# Patient Record
Sex: Female | Born: 1935 | Race: Black or African American | Hispanic: No | Smoking: Never smoker
Health system: Southern US, Community
[De-identification: ages and names within clinical notes are randomized; demographics above are authoritative.]

## PROBLEM LIST (undated history)

## (undated) DIAGNOSIS — I10 Essential (primary) hypertension: Secondary | ICD-10-CM

## (undated) DIAGNOSIS — N289 Disorder of kidney and ureter, unspecified: Secondary | ICD-10-CM

## (undated) DIAGNOSIS — C50919 Malignant neoplasm of unspecified site of unspecified female breast: Secondary | ICD-10-CM

## (undated) DIAGNOSIS — E119 Type 2 diabetes mellitus without complications: Secondary | ICD-10-CM

## (undated) HISTORY — DX: Essential (primary) hypertension: I10

## (undated) HISTORY — PX: NO PAST SURGERIES: SHX2092

## (undated) HISTORY — DX: Malignant neoplasm of unspecified site of unspecified female breast: C50.919

---

## 2009-06-28 ENCOUNTER — Ambulatory Visit (HOSPITAL_COMMUNITY): Admission: RE | Admit: 2009-06-28 | Discharge: 2009-06-28 | Payer: Self-pay | Admitting: Internal Medicine

## 2012-06-12 ENCOUNTER — Ambulatory Visit (HOSPITAL_COMMUNITY)
Admission: RE | Admit: 2012-06-12 | Discharge: 2012-06-12 | Disposition: A | Discharge status: Home / Self Care | Payer: Medicare Other | Source: Ambulatory Visit | Attending: Internal Medicine | Admitting: Internal Medicine

## 2012-06-12 ENCOUNTER — Other Ambulatory Visit (HOSPITAL_COMMUNITY): Payer: Self-pay | Admitting: Internal Medicine

## 2012-06-12 DIAGNOSIS — M25552 Pain in left hip: Secondary | ICD-10-CM

## 2012-06-12 DIAGNOSIS — M25551 Pain in right hip: Secondary | ICD-10-CM

## 2012-06-12 DIAGNOSIS — M545 Low back pain, unspecified: Secondary | ICD-10-CM | POA: Insufficient documentation

## 2012-06-12 DIAGNOSIS — M25559 Pain in unspecified hip: Secondary | ICD-10-CM | POA: Insufficient documentation

## 2013-05-06 ENCOUNTER — Emergency Department (HOSPITAL_COMMUNITY): Payer: Medicare Other

## 2013-05-06 ENCOUNTER — Inpatient Hospital Stay (HOSPITAL_COMMUNITY)
Admission: EM | Admit: 2013-05-06 | Discharge: 2013-05-11 | DRG: 584 | Disposition: A | Discharge status: Home Health | Payer: Medicare Other | Attending: Internal Medicine | Admitting: Internal Medicine

## 2013-05-06 ENCOUNTER — Encounter (HOSPITAL_COMMUNITY): Payer: Self-pay | Admitting: Emergency Medicine

## 2013-05-06 DIAGNOSIS — C50919 Malignant neoplasm of unspecified site of unspecified female breast: Principal | ICD-10-CM | POA: Diagnosis present

## 2013-05-06 DIAGNOSIS — I10 Essential (primary) hypertension: Secondary | ICD-10-CM

## 2013-05-06 DIAGNOSIS — Z87891 Personal history of nicotine dependence: Secondary | ICD-10-CM

## 2013-05-06 DIAGNOSIS — C779 Secondary and unspecified malignant neoplasm of lymph node, unspecified: Secondary | ICD-10-CM

## 2013-05-06 HISTORY — DX: Type 2 diabetes mellitus without complications: E11.9

## 2013-05-06 HISTORY — DX: Disorder of kidney and ureter, unspecified: N28.9

## 2013-05-06 LAB — URINALYSIS, ROUTINE W REFLEX MICROSCOPIC
BILIRUBIN URINE: NEGATIVE
GLUCOSE, UA: NEGATIVE mg/dL
Hgb urine dipstick: NEGATIVE
Ketones, ur: NEGATIVE mg/dL
LEUKOCYTES UA: NEGATIVE
Nitrite: NEGATIVE
PROTEIN: NEGATIVE mg/dL
Specific Gravity, Urine: 1.025 (ref 1.005–1.030)
UROBILINOGEN UA: 1 mg/dL (ref 0.0–1.0)
pH: 5.5 (ref 5.0–8.0)

## 2013-05-06 LAB — CBC WITH DIFFERENTIAL/PLATELET
Basophils Absolute: 0 10*3/uL (ref 0.0–0.1)
Basophils Relative: 0 % (ref 0–1)
Eosinophils Absolute: 0.2 10*3/uL (ref 0.0–0.7)
Eosinophils Relative: 3 % (ref 0–5)
HEMATOCRIT: 31.6 % — AB (ref 36.0–46.0)
HEMOGLOBIN: 10.3 g/dL — AB (ref 12.0–15.0)
Lymphocytes Relative: 32 % (ref 12–46)
Lymphs Abs: 1.6 10*3/uL (ref 0.7–4.0)
MCH: 27.3 pg (ref 26.0–34.0)
MCHC: 32.6 g/dL (ref 30.0–36.0)
MCV: 83.8 fL (ref 78.0–100.0)
Monocytes Absolute: 0.5 10*3/uL (ref 0.1–1.0)
Monocytes Relative: 11 % (ref 3–12)
NEUTROS ABS: 2.5 10*3/uL (ref 1.7–7.7)
NEUTROS PCT: 54 % (ref 43–77)
Platelets: 243 10*3/uL (ref 150–400)
RBC: 3.77 MIL/uL — AB (ref 3.87–5.11)
RDW: 14.3 % (ref 11.5–15.5)
WBC: 4.8 10*3/uL (ref 4.0–10.5)

## 2013-05-06 LAB — COMPREHENSIVE METABOLIC PANEL
ALBUMIN: 3.2 g/dL — AB (ref 3.5–5.2)
ALT: 6 U/L (ref 0–35)
AST: 14 U/L (ref 0–37)
Alkaline Phosphatase: 154 U/L — ABNORMAL HIGH (ref 39–117)
BUN: 20 mg/dL (ref 6–23)
CALCIUM: 11.6 mg/dL — AB (ref 8.4–10.5)
CO2: 25 meq/L (ref 19–32)
Chloride: 101 mEq/L (ref 96–112)
Creatinine, Ser: 0.84 mg/dL (ref 0.50–1.10)
GFR, EST AFRICAN AMERICAN: 74 mL/min — AB (ref >90)
GFR, EST NON AFRICAN AMERICAN: 63 mL/min — AB (ref >90)
GLUCOSE: 112 mg/dL — AB (ref 70–99)
POTASSIUM: 3.6 meq/L — AB (ref 3.7–5.3)
SODIUM: 141 meq/L (ref 137–147)
TOTAL PROTEIN: 8.6 g/dL — AB (ref 6.0–8.3)
Total Bilirubin: 0.3 mg/dL (ref 0.3–1.2)

## 2013-05-06 MED ORDER — HYDROCHLOROTHIAZIDE 12.5 MG PO CAPS
12.5000 mg | ORAL_CAPSULE | Freq: Every day | ORAL | Status: DC
Start: 2013-05-07 — End: 2013-05-11
  Administered 2013-05-07 – 2013-05-10 (×4): 12.5 mg via ORAL
  Filled 2013-05-06 (×3): qty 1

## 2013-05-06 MED ORDER — HYDROCODONE-ACETAMINOPHEN 5-325 MG PO TABS
1.0000 | ORAL_TABLET | Freq: Four times a day (QID) | ORAL | Status: DC | PRN
  Administered 2013-05-08 – 2013-05-10 (×3): 1 via ORAL
  Filled 2013-05-06 (×4): qty 1

## 2013-05-06 MED ORDER — LOSARTAN POTASSIUM-HCTZ 50-12.5 MG PO TABS: 1.0000 | ORAL_TABLET | Freq: Every evening | ORAL | Status: DC

## 2013-05-06 MED ORDER — SODIUM CHLORIDE 0.9 % IV SOLN
INTRAVENOUS | Status: DC
  Administered 2013-05-07 – 2013-05-08 (×2): via INTRAVENOUS

## 2013-05-06 MED ORDER — LOSARTAN POTASSIUM 50 MG PO TABS
50.0000 mg | ORAL_TABLET | Freq: Every day | ORAL | Status: DC
  Administered 2013-05-07 – 2013-05-10 (×4): 50 mg via ORAL
  Filled 2013-05-06 (×3): qty 1

## 2013-05-06 MED ORDER — ENOXAPARIN SODIUM 40 MG/0.4ML ~~LOC~~ SOLN
40.0000 mg | SUBCUTANEOUS | Status: DC
  Administered 2013-05-07: 40 mg via SUBCUTANEOUS
  Filled 2013-05-06: qty 0.4

## 2013-05-06 MED ORDER — SODIUM CHLORIDE 0.9 % IV BOLUS (SEPSIS)
500.0000 mL | Freq: Once | INTRAVENOUS | Status: AC
  Administered 2013-05-06: 500 mL via INTRAVENOUS

## 2013-05-06 MED ORDER — AMLODIPINE BESYLATE 5 MG PO TABS
5.0000 mg | ORAL_TABLET | Freq: Every day | ORAL | Status: DC
  Administered 2013-05-07 – 2013-05-11 (×4): 5 mg via ORAL
  Filled 2013-05-06 (×4): qty 1

## 2013-05-06 MED ORDER — POTASSIUM CHLORIDE CRYS ER 20 MEQ PO TBCR
40.0000 meq | EXTENDED_RELEASE_TABLET | Freq: Once | ORAL | Status: AC
Start: 2013-05-06 — End: 2013-05-07
  Administered 2013-05-07: 40 meq via ORAL
  Filled 2013-05-06: qty 2

## 2013-05-06 NOTE — ED Provider Notes (Signed)
CSN: [id number]     Arrival date & time 05/06/13  1834 History  This chart was scribed for Kelli Diego, MD by Kelli Lopez, ED Scribe. This patient was seen in room APA10/APA10 and the patient's care was started at [facility] PM.   Chief Complaint  Patient presents with  . Well Check      Patient is a 80 y.o. female presenting with extremity weakness. The history is provided by the patient and a caregiver. No language interpreter was used.  Extremity Weakness This is a chronic problem. Episode onset: worse today. The problem occurs constantly. The problem has been gradually worsening. Pertinent negatives include no chest pain, no abdominal pain and no headaches. The symptoms are aggravated by walking. Nothing relieves the symptoms. She has tried nothing for the symptoms.    HPI Comments: Kelli Lopez is a 80 y.o. female brought in by ambulance, who presents to the Emergency Department complaining of weakness with associated generalized pain for the past month. Caregivers report that the pt has weakness at baseline but noted that the pt had increased weakness on the right side this morning upon waking which has continued to worsen throughout the day. At baseline, she ambulates on her own. For the past few weeks, she has needed assistance walking but today was noted to need more assistance and appeared unsteady on the right leg. Dr. Legrand Lopez has seen the pt since the sxs onset with no dx or improvement. Caregivers deny any fever or cough.   PCP is Dr. Legrand Lopez  Past Medical History  Diagnosis Date  . Renal disorder    History reviewed. No pertinent past surgical history. No family history on file. History  Substance Use Topics  . Smoking status: Never Smoker   . Smokeless tobacco: Not on file  . Alcohol Use: No   No OB history provided.  Review of Systems  Constitutional: Negative for appetite change and fatigue.  HENT: Negative for congestion, ear discharge and sinus pressure.    Eyes: Negative for discharge.  Respiratory: Negative for cough.   Cardiovascular: Negative for chest pain.  Gastrointestinal: Negative for abdominal pain and diarrhea.  Genitourinary: Negative for frequency and hematuria.  Musculoskeletal: Positive for extremity weakness. Negative for back pain.  Skin: Negative for rash.  Neurological: Positive for weakness. Negative for seizures and headaches.  Psychiatric/Behavioral: Negative for hallucinations.      Allergies  Review of patient's allergies indicates no known allergies.  Home Medications  No current outpatient prescriptions on file.  Triage Vitals: BP 150/55  Pulse 101  Temp(Src) 98 F (36.7 C) (Oral)  Resp 16  Wt 137 lb (62.143 kg)  SpO2 97%  Physical Exam  Nursing note and vitals reviewed. Constitutional: She is oriented to person, place, and time. She appears well-developed.  Elderly female   HENT:  Head: Normocephalic and atraumatic.  Eyes: Conjunctivae and EOM are normal. No scleral icterus.  Neck: Neck supple. No thyromegaly present.  Cardiovascular: Normal rate and regular rhythm.  Exam reveals no gallop and no friction rub.   No murmur heard. Pulmonary/Chest: Effort normal and breath sounds normal. No stridor. She has no wheezes. She has no rales. She exhibits no tenderness.  Large mass to LUQ of right breast.Chaperone present for breast exam.  Abdominal: She exhibits no distension. There is no tenderness. There is no rebound.  Musculoskeletal: Normal range of motion. She exhibits no edema.  Lymphadenopathy:    She has no cervical adenopathy.  Neurological: She is alert  and oriented to person, place, and time. She exhibits normal muscle tone. Coordination normal.  No pronator's drift. Moderate weakness in the left leg. Able to lift arms and legs against gravity  Skin: Skin is warm and dry. No rash noted. No erythema.  Psychiatric: She has a normal mood and affect. Her behavior is normal.    ED Course   Procedures (including critical care time)  DIAGNOSTIC STUDIES: Oxygen Saturation is 97% on RA, adequate by my interpretation.    COORDINATION OF CARE: 6:50 PM-Discussed treatment plan which includes CT head, CXR, CBC panel, CMP and UA with pt at bedside and pt agreed to plan.   9:52 PM- Discussed admission with pt and family and both agreed.  10:02 PM-Consult complete with Hospitalist. Patient case explained and discussed. Hospitalist agrees to admit patient for further evaluation and treatment. Call ended at 10:04 PM.  Labs Review Labs Reviewed  CBC WITH DIFFERENTIAL - Abnormal; Notable for the following:    RBC 3.77 (*)    Hemoglobin 10.3 (*)    HCT 31.6 (*)    All other components within normal limits  COMPREHENSIVE METABOLIC PANEL - Abnormal; Notable for the following:    Potassium 3.6 (*)    Glucose, Bld 112 (*)    Calcium 11.6 (*)    Total Protein 8.6 (*)    Albumin 3.2 (*)    Alkaline Phosphatase 154 (*)    GFR calc non Af Amer 63 (*)    GFR calc Af Amer 74 (*)    All other components within normal limits  URINALYSIS, ROUTINE W REFLEX MICROSCOPIC   Imaging Review Dg Chest 2 View  05/06/2013   CLINICAL DATA:  Chest pain  EXAM: CHEST  2 VIEW  COMPARISON:  None.  FINDINGS: Cardiac shadow is within normal limits. Diffuse density is noted particularly in the right lateral lung base. Would be difficult to exclude an acute infiltrate. Some soft tissue prominence is noted to the right of the trachea. The mass lesion would be difficult to exclude as there is some soft tissue density in this region on the lateral projection although it this likely represents a tortuous right innominate artery as there is some calcification adjacent to the density. Diffuse osteopenia is noted. Some old rib fractures with healing are noted bilaterally. No acute bony abnormality is noted.  IMPRESSION: Likely chronic changes although an acute right basilar infiltrate cannot be totally excluded.  Density  to the right of the trachea likely related to tortuous vascularity. Better position followup films may be helpful when the patient's condition improves.   Electronically Signed   By: Kelli Lopez M.D.   On: 05/06/2013 19:36   Ct Head Wo Contrast  05/06/2013   CLINICAL DATA:  Weakness for 1 month  EXAM: CT HEAD WITHOUT CONTRAST  TECHNIQUE: Contiguous axial images were obtained from the base of the skull through the vertex without intravenous contrast.  COMPARISON:  None.  FINDINGS: Mild prominence of the sulci, cisterns, and ventricles, in keeping with mild cerebral and cerebellar volume loss. Mild to moderate subcortical and periventricular white matter hypodensities, a nonspecific finding often seen in the setting of chronic microangiopathic change. No definite CT evidence of an acute infarction. No intraparenchymal hemorrhage, mass, mass effect, or abnormal extra-axial fluid collection. No hydrocephalus. Atherosclerotic vascular calcifications. The visualized paranasal sinuses and mastoid air cells are predominantly clear.  IMPRESSION: Volume loss and white matter changes as above. No definite CT evidence of acute intracranial abnormality.   Electronically  Signed   By: Carlos Levering M.D.   On: 05/06/2013 21:11     EKG Interpretation None      MDM   Final diagnoses:  None  admit for evaluation of breast ca and hypercalcemia The chart was scribed for me under my direct supervision.  I personally performed the history, physical, and medical decision making and all procedures in the evaluation of this patient.Kelli Diego, MD 05/06/13 2206

## 2013-05-06 NOTE — H&P (Signed)
PCP:   No primary provider on file.   Chief Complaint:  Weakness   HPI:  80 year old female with a history of hypertension, was brought to the hospital with generalized weakness for past one month, as per caregivers patient ambulate on her own but for the past weeks she has needed assistance but today she needed more assistance and was unsteady on her feet. There is no history of shortness of breath no chest pain no nausea vomiting or diarrhea no abdominal pain. Patient has lost 12 pounds of weight over the past one month. She has not been eating and drinking well.  In the ED patient was found to have hypercalcemia, calcium of 12.24 (corrected).   Allergies:  No Known Allergies    Past Medical History  Diagnosis Date  . Renal disorder     History reviewed. No pertinent past surgical history.  Prior to Admission medications   Medication Sig Start Date End Date Taking? Authorizing Provider  amLODipine (NORVASC) 5 MG tablet Take 5 mg by mouth daily. 04/20/13  Yes Historical Provider, MD  HYDROcodone-acetaminophen (NORCO/VICODIN) 5-325 MG per tablet Take 1 tablet by mouth 2 (two) times daily as needed. For pain 05/06/13  Yes Historical Provider, MD  losartan-hydrochlorothiazide (HYZAAR) 50-12.5 MG per tablet Take 1 tablet by mouth every evening.  03/29/13  Yes Historical Provider, MD  metFORMIN (GLUCOPHAGE) 500 MG tablet Take 500 mg by mouth daily. Prescribed one tablet twice daily but only takes once daily 05/01/13  Yes Historical Provider, MD  Vitamin D, Ergocalciferol, (DRISDOL) 50000 UNITS CAPS capsule Take 50,000 Units by mouth every 7 (seven) days.   Yes Historical Provider, MD    Social History:  reports that she has never smoked. She does not have any smokeless tobacco history on file. She reports that she does not drink alcohol or use illicit drugs.    All the positives are listed in BOLD  Review of Systems:  HEENT: Headache, blurred vision, runny nose, sore throat Neck:  Hypothyroidism, hyperthyroidism,,lymphadenopathy Chest : Shortness of breath, history of COPD, Asthma Heart : Chest pain, history of coronary arterey disease GI:  Nausea, vomiting, diarrhea, constipation, GERD GU: Dysuria, urgency, frequency of urination, hematuria Neuro: Stroke, seizures, syncope Psych: Depression, anxiety, hallucinations   Physical Exam: Blood pressure 150/55, pulse 101, temperature 98 F (36.7 C), temperature source Oral, resp. rate 16, weight 62.143 kg (137 lb), SpO2 97.00%. Constitutional:   Patient is a well-developed and well-nourished *female in no acute distress and cooperative with exam. Head: Normocephalic and atraumatic Mouth: Mucus membranes moist Eyes: PERRL, EOMI, conjunctivae normal Neck: Supple, No Thyromegaly Cardiovascular: RRR, S1 normal, S2 normal Pulmonary/Chest: CTAB, no wheezes, rales, or rhonchi Breast- breast was examined with nurse as Chaperone, right breast has hard lumps noted, attached to the chest wall Abdominal: Soft. Non-tender, non-distended, bowel sounds are normal, no masses, organomegaly, or guarding present.  Neurological: A&O x3, Strenght is normal and symmetric bilaterally, cranial nerve II-XII are grossly intact, no focal motor deficit, sensory intact to light touch bilaterally.  Extremities : No Cyanosis, Clubbing or Edema   Labs on Admission:  Results for orders placed during the hospital encounter of 05/06/13 (from the past 48 hour(s))  CBC WITH DIFFERENTIAL     Status: Abnormal   Collection Time    05/06/13  7:32 PM      Result Value Ref Range   WBC 4.8  4.0 - 10.5 K/uL   RBC 3.77 (*) 3.87 - 5.11 MIL/uL   Hemoglobin 10.3 (*)  12.0 - 15.0 g/dL   HCT 31.6 (*) 36.0 - 46.0 %   MCV 83.8  78.0 - 100.0 fL   MCH 27.3  26.0 - 34.0 pg   MCHC 32.6  30.0 - 36.0 g/dL   RDW 14.3  11.5 - 15.5 %   Platelets 243  150 - 400 K/uL   Neutrophils Relative % 54  43 - 77 %   Neutro Abs 2.5  1.7 - 7.7 K/uL   Lymphocytes Relative 32  12 -  46 %   Lymphs Abs 1.6  0.7 - 4.0 K/uL   Monocytes Relative 11  3 - 12 %   Monocytes Absolute 0.5  0.1 - 1.0 K/uL   Eosinophils Relative 3  0 - 5 %   Eosinophils Absolute 0.2  0.0 - 0.7 K/uL   Basophils Relative 0  0 - 1 %   Basophils Absolute 0.0  0.0 - 0.1 K/uL  COMPREHENSIVE METABOLIC PANEL     Status: Abnormal   Collection Time    05/06/13  7:32 PM      Result Value Ref Range   Sodium 141  137 - 147 mEq/L   Potassium 3.6 (*) 3.7 - 5.3 mEq/L   Chloride 101  96 - 112 mEq/L   CO2 25  19 - 32 mEq/L   Glucose, Bld 112 (*) 70 - 99 mg/dL   BUN 20  6 - 23 mg/dL   Creatinine, Ser 0.84  0.50 - 1.10 mg/dL   Calcium 11.6 (*) 8.4 - 10.5 mg/dL   Total Protein 8.6 (*) 6.0 - 8.3 g/dL   Albumin 3.2 (*) 3.5 - 5.2 g/dL   AST 14  0 - 37 U/L   ALT 6  0 - 35 U/L   Alkaline Phosphatase 154 (*) 39 - 117 U/L   Total Bilirubin 0.3  0.3 - 1.2 mg/dL   GFR calc non Af Amer 63 (*) >90 mL/min   GFR calc Af Amer 74 (*) >90 mL/min   Comment: (NOTE)     The eGFR has been calculated using the CKD EPI equation.     This calculation has not been validated in all clinical situations.     eGFR's persistently <90 mL/min signify possible Chronic Kidney     Disease.  URINALYSIS, ROUTINE W REFLEX MICROSCOPIC     Status: None   Collection Time    05/06/13  7:44 PM      Result Value Ref Range   Color, Urine YELLOW  YELLOW   APPearance CLEAR  CLEAR   Specific Gravity, Urine 1.025  1.005 - 1.030   pH 5.5  5.0 - 8.0   Glucose, UA NEGATIVE  NEGATIVE mg/dL   Hgb urine dipstick NEGATIVE  NEGATIVE   Bilirubin Urine NEGATIVE  NEGATIVE   Ketones, ur NEGATIVE  NEGATIVE mg/dL   Protein, ur NEGATIVE  NEGATIVE mg/dL   Urobilinogen, UA 1.0  0.0 - 1.0 mg/dL   Nitrite NEGATIVE  NEGATIVE   Leukocytes, UA NEGATIVE  NEGATIVE   Comment: MICROSCOPIC NOT DONE ON URINES WITH NEGATIVE PROTEIN, BLOOD, LEUKOCYTES, NITRITE, OR GLUCOSE <1000 mg/dL.    Radiological Exams on Admission: Dg Chest 2 View  05/06/2013   CLINICAL DATA:   Chest pain  EXAM: CHEST  2 VIEW  COMPARISON:  None.  FINDINGS: Cardiac shadow is within normal limits. Diffuse density is noted particularly in the right lateral lung base. Would be difficult to exclude an acute infiltrate. Some soft tissue prominence is noted to the  right of the trachea. The mass lesion would be difficult to exclude as there is some soft tissue density in this region on the lateral projection although it this likely represents a tortuous right innominate artery as there is some calcification adjacent to the density. Diffuse osteopenia is noted. Some old rib fractures with healing are noted bilaterally. No acute bony abnormality is noted.  IMPRESSION: Likely chronic changes although an acute right basilar infiltrate cannot be totally excluded.  Density to the right of the trachea likely related to tortuous vascularity. Better position followup films may be helpful when the patient's condition improves.   Electronically Signed   By: Inez Catalina M.D.   On: 05/06/2013 19:36   Ct Head Wo Contrast  05/06/2013   CLINICAL DATA:  Weakness for 1 month  EXAM: CT HEAD WITHOUT CONTRAST  TECHNIQUE: Contiguous axial images were obtained from the base of the skull through the vertex without intravenous contrast.  COMPARISON:  None.  FINDINGS: Mild prominence of the sulci, cisterns, and ventricles, in keeping with mild cerebral and cerebellar volume loss. Mild to moderate subcortical and periventricular white matter hypodensities, a nonspecific finding often seen in the setting of chronic microangiopathic change. No definite CT evidence of an acute infarction. No intraparenchymal hemorrhage, mass, mass effect, or abnormal extra-axial fluid collection. No hydrocephalus. Atherosclerotic vascular calcifications. The visualized paranasal sinuses and mastoid air cells are predominantly clear.  IMPRESSION: Volume loss and white matter changes as above. No definite CT evidence of acute intracranial abnormality.    Electronically Signed   By: Carlos Levering M.D.   On: 05/06/2013 21:11    Assessment/Plan Principal Problem:   Hypercalcemia Active Problems:   Breast CA   HTN (hypertension)  ? Breast cancer There is a hard lumpy mass in the right breast which is attached to the chest wall, family wants to pursue further with biopsy. Consider surgical evaluation in a.m.  Hypercalcemia Likely due to above, will start IV fluids at 75 mL per hour Check calcium the morning  Hypertension Continue HCTZ/lisinopril, amlodipine BP is controlled at this time  DVT prophylaxis Lovenox  Code status: Patient is full code  Family discussion: Discussed with patient's daughter and granddaughter at bedside   Time Spent on Admission: 60 minutes  [facility] Pager: [phone] 05/06/2013, 10:26 PM  If 7PM-7AM, please contact night-coverage  [email or web address]  Password [email or web address]

## 2013-05-06 NOTE — ED Notes (Signed)
Sore to right breast.  Dr. Roderic Palau notified.

## 2013-05-06 NOTE — ED Notes (Signed)
Patient with c/o "all over pain" x 2-3 weeks. Alert/oriented x 4. No distress.

## 2013-05-07 LAB — COMPREHENSIVE METABOLIC PANEL
ALBUMIN: 2.9 g/dL — AB (ref 3.5–5.2)
ALK PHOS: 136 U/L — AB (ref 39–117)
ALT: <5 U/L (ref 0–35)
AST: 13 U/L (ref 0–37)
BILIRUBIN TOTAL: 0.3 mg/dL (ref 0.3–1.2)
BUN: 16 mg/dL (ref 6–23)
CO2: 25 meq/L (ref 19–32)
CREATININE: 0.74 mg/dL (ref 0.50–1.10)
Calcium: 11 mg/dL — ABNORMAL HIGH (ref 8.4–10.5)
Chloride: 107 mEq/L (ref 96–112)
GFR calc Af Amer: >90 mL/min (ref >90)
GFR calc non Af Amer: 78 mL/min — ABNORMAL LOW (ref >90)
Glucose, Bld: 94 mg/dL (ref 70–99)
Potassium: 4.5 mEq/L (ref 3.7–5.3)
Sodium: 143 mEq/L (ref 137–147)
Total Protein: 7.6 g/dL (ref 6.0–8.3)

## 2013-05-07 LAB — CBC
HEMATOCRIT: 28.9 % — AB (ref 36.0–46.0)
HEMOGLOBIN: 9.5 g/dL — AB (ref 12.0–15.0)
MCH: 27.5 pg (ref 26.0–34.0)
MCHC: 32.9 g/dL (ref 30.0–36.0)
MCV: 83.5 fL (ref 78.0–100.0)
Platelets: 230 10*3/uL (ref 150–400)
RBC: 3.46 MIL/uL — AB (ref 3.87–5.11)
RDW: 14.4 % (ref 11.5–15.5)
WBC: 4.6 10*3/uL (ref 4.0–10.5)

## 2013-05-07 MED ORDER — SODIUM CHLORIDE 0.9 % IV SOLN
90.0000 mg | Freq: Once | INTRAVENOUS | Status: AC
  Administered 2013-05-07: 90 mg via INTRAVENOUS
  Filled 2013-05-07: qty 10

## 2013-05-07 MED ORDER — ENOXAPARIN SODIUM 40 MG/0.4ML ~~LOC~~ SOLN
40.0000 mg | SUBCUTANEOUS | Status: DC
  Administered 2013-05-08: 40 mg via SUBCUTANEOUS
  Filled 2013-05-07: qty 0.4

## 2013-05-07 MED ORDER — MORPHINE SULFATE 2 MG/ML IJ SOLN: 1.0000 mg | INTRAMUSCULAR | Status: DC | PRN

## 2013-05-07 MED ORDER — CAMPHOR-MENTHOL 0.5-0.5 % EX LOTN
TOPICAL_LOTION | CUTANEOUS | Status: DC | PRN
  Filled 2013-05-07: qty 222

## 2013-05-07 MED ORDER — ENOXAPARIN SODIUM 40 MG/0.4ML ~~LOC~~ SOLN: 40.0000 mg | SUBCUTANEOUS | Status: DC

## 2013-05-07 NOTE — Progress Notes (Signed)
Patients family called the nurses station requesting pain medicine for the patient. When I went in the room the family also notified me that the vicodin is not providing any relief. I paged the on-call physician, will follow any orders given.

## 2013-05-07 NOTE — Progress Notes (Signed)
Subjective: Patient was admitted last night due to generalized weakness. She was found to have hypercalcemia and rt breast mass that is fixed to the bone.   Objective: Vital signs in last 24 hours: Temp:  [97.7 F (36.5 C)-98.8 F (37.1 C)] 98.8 F (37.1 C) (03/13 0450) Pulse Rate:  [78-101] 78 (03/13 0450) Resp:  [16-18] 18 (03/13 0450) BP: (125-150)/(55-83) 125/83 mmHg (03/13 0450) SpO2:  [94 %-99 %] 94 % (03/13 0450) Weight:  [137 lb (62.143 kg)] 137 lb (62.143 kg) (03/12 2325) Weight change:  Last BM Date: 05/04/13  Intake/Output from previous day:    PHYSICAL EXAM General appearance: alert and no distress Resp: clear to auscultation bilaterally Cardio: S1, S2 normal GI: soft, non-tender; bowel sounds normal; no masses,  no organomegaly Extremities: extremities normal, atraumatic, no cyanosis or edema  Lab Results:    @labtest @ [facility] No results found for this basename: PHART, PCO2, PO2ART, TCO2, HCO3,  in the last 72 hours CULTURES No results found for this or any previous visit (from the past 240 hour(s)). Studies/Results: Dg Chest 2 View  05/06/2013   CLINICAL DATA:  Chest pain  EXAM: CHEST  2 VIEW  COMPARISON:  None.  FINDINGS: Cardiac shadow is within normal limits. Diffuse density is noted particularly in the right lateral lung base. Would be difficult to exclude an acute infiltrate. Some soft tissue prominence is noted to the right of the trachea. The mass lesion would be difficult to exclude as there is some soft tissue density in this region on the lateral projection although it this likely represents a tortuous right innominate artery as there is some calcification adjacent to the density. Diffuse osteopenia is noted. Some old rib fractures with healing are noted bilaterally. No acute bony abnormality is noted.  IMPRESSION: Likely chronic changes although an acute right basilar infiltrate cannot be totally excluded.  Density to the right of the trachea likely related  to tortuous vascularity. Better position followup films may be helpful when the patient's condition improves.   Electronically Signed   By: Inez Catalina M.D.   On: 05/06/2013 19:36   Ct Head Wo Contrast  05/06/2013   CLINICAL DATA:  Weakness for 1 month  EXAM: CT HEAD WITHOUT CONTRAST  TECHNIQUE: Contiguous axial images were obtained from the base of the skull through the vertex without intravenous contrast.  COMPARISON:  None.  FINDINGS: Mild prominence of the sulci, cisterns, and ventricles, in keeping with mild cerebral and cerebellar volume loss. Mild to moderate subcortical and periventricular white matter hypodensities, a nonspecific finding often seen in the setting of chronic microangiopathic change. No definite CT evidence of an acute infarction. No intraparenchymal hemorrhage, mass, mass effect, or abnormal extra-axial fluid collection. No hydrocephalus. Atherosclerotic vascular calcifications. The visualized paranasal sinuses and mastoid air cells are predominantly clear.  IMPRESSION: Volume loss and white matter changes as above. No definite CT evidence of acute intracranial abnormality.   Electronically Signed   By: Carlos Levering M.D.   On: 05/06/2013 21:11    Medications: I have reviewed the patient's current medications.  Assesment: Principal Problem:   Hypercalcemia Active Problems:   Breast CA   HTN (hypertension)    Plan: Will increase IV fluid to 100cc/hr Will monitor CBC and BMP Will discuss with her daughter who is her power attorney about her breast mass which seems to be advanced Ca o the breast.     LOS: 1 day   Marlow Hendrie 05/07/2013, 7:57 AM

## 2013-05-07 NOTE — Consult Note (Signed)
[facility] Consultation Oncology  Name: Kelli Lopez      MRN: [id number]    Location: A301/A301-01  Date: 05/07/2013 Time:4:50 PM   REFERRING PHYSICIAN:  Aviva Signs, MD  REASON FOR CONSULT:  Right breast mass   DIAGNOSIS:  Inflammatory breast cancer  HISTORY OF PRESENT ILLNESS:   Kelli Lopez is a pleasant 80 year old African American woman who presented to the ED on 05/06/2013 with weakness.  She was found to be hypercalcemic and subsequently admitted.   Oncology was consulted by Dr. Arnoldo Morale and the decision was made to wait for oncology consult on Monday, but on my chart review, the patient is noted to be hypercalcemic and therefore needs intervention to prevent any worsening of this abnormality.   The patient was seen by Dr. Arnoldo Morale on 3/13 in consultation. 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  She notes a 30 lb weight loss over 2-6 months.  Her appetite is decreased significantly.  He admits to a distant smoking history.  The patient denies having mammogram in the past and reports "I do not believe in them."  She live at home and the patient's daughters rotate staying with her.  She is not alone at home.  Approximately 9 family members were present in the room during discussion and therefore an exam was deferred at this time.   I personally reviewed and went over laboratory results with the patient.  The results are noted within this dictation.  PAST MEDICAL HISTORY:   Past Medical History  Diagnosis Date  . Renal disorder     ALLERGIES: No Known Allergies    MEDICATIONS: I have reviewed the patient's current medications.     PAST SURGICAL HISTORY History reviewed. No pertinent past surgical history.  FAMILY  HISTORY: No family history on file.  SOCIAL HISTORY:  reports that she has never smoked. She does not have any smokeless tobacco history on file. She reports that she does not drink alcohol or use illicit drugs.  PHYSICAL EXAM: Most Recent Vital Signs: Blood pressure 130/51, pulse 86, temperature 98.2 F (36.8 C), temperature source Oral, resp. rate 18, height 5' 2"  (1.575 m), weight 137 lb (62.143 kg), SpO2 95.00%. General appearance: alert, cooperative, appears stated age and skinny Head: Normocephalic, without obvious abnormality, atraumatic Eyes: negative findings: lids and lashes normal, conjunctivae and sclerae normal and corneas clear Neurologic: Grossly normal Physical exam is deferred at this time due to large number of visiting family members.  Will perform close exam during her hospital stay.  LABORATORY DATA:  Results for orders placed during the hospital encounter of 05/06/13 (from the past 48 hour(s))  CBC WITH DIFFERENTIAL     Status: Abnormal   Collection Time    05/06/13  7:32 PM      Result Value Ref Range   WBC 4.8  4.0 - 10.5 K/uL   RBC 3.77 (*) 3.87 - 5.11 MIL/uL   Hemoglobin 10.3 (*) 12.0 - 15.0 g/dL   HCT 31.6 (*) 36.0 - 46.0 %   MCV 83.8  78.0 - 100.0 fL   MCH 27.3  26.0 - 34.0 pg   MCHC 32.6  30.0 - 36.0 g/dL   RDW 14.3  11.5 - 15.5 %   Platelets 243  150 -  400 K/uL   Neutrophils Relative % 54  43 - 77 %   Neutro Abs 2.5  1.7 - 7.7 K/uL   Lymphocytes Relative 32  12 - 46 %   Lymphs Abs 1.6  0.7 - 4.0 K/uL   Monocytes Relative 11  3 - 12 %   Monocytes Absolute 0.5  0.1 - 1.0 K/uL   Eosinophils Relative 3  0 - 5 %   Eosinophils Absolute 0.2  0.0 - 0.7 K/uL   Basophils Relative 0  0 - 1 %   Basophils Absolute 0.0  0.0 - 0.1 K/uL  COMPREHENSIVE METABOLIC PANEL     Status: Abnormal   Collection Time    05/06/13  7:32 PM      Result Value Ref Range   Sodium 141  137 - 147 mEq/L   Potassium 3.6 (*) 3.7 - 5.3 mEq/L   Chloride 101  96 - 112 mEq/L   CO2  25  19 - 32 mEq/L   Glucose, Bld 112 (*) 70 - 99 mg/dL   BUN 20  6 - 23 mg/dL   Creatinine, Ser 0.84  0.50 - 1.10 mg/dL   Calcium 11.6 (*) 8.4 - 10.5 mg/dL   Total Protein 8.6 (*) 6.0 - 8.3 g/dL   Albumin 3.2 (*) 3.5 - 5.2 g/dL   AST 14  0 - 37 U/L   ALT 6  0 - 35 U/L   Alkaline Phosphatase 154 (*) 39 - 117 U/L   Total Bilirubin 0.3  0.3 - 1.2 mg/dL   GFR calc non Af Amer 63 (*) >90 mL/min   GFR calc Af Amer 74 (*) >90 mL/min   Comment: (NOTE)     The eGFR has been calculated using the CKD EPI equation.     This calculation has not been validated in all clinical situations.     eGFR's persistently <90 mL/min signify possible Chronic Kidney     Disease.  URINALYSIS, ROUTINE W REFLEX MICROSCOPIC     Status: None   Collection Time    05/06/13  7:44 PM      Result Value Ref Range   Color, Urine YELLOW  YELLOW   APPearance CLEAR  CLEAR   Specific Gravity, Urine 1.025  1.005 - 1.030   pH 5.5  5.0 - 8.0   Glucose, UA NEGATIVE  NEGATIVE mg/dL   Hgb urine dipstick NEGATIVE  NEGATIVE   Bilirubin Urine NEGATIVE  NEGATIVE   Ketones, ur NEGATIVE  NEGATIVE mg/dL   Protein, ur NEGATIVE  NEGATIVE mg/dL   Urobilinogen, UA 1.0  0.0 - 1.0 mg/dL   Nitrite NEGATIVE  NEGATIVE   Leukocytes, UA NEGATIVE  NEGATIVE   Comment: MICROSCOPIC NOT DONE ON URINES WITH NEGATIVE PROTEIN, BLOOD, LEUKOCYTES, NITRITE, OR GLUCOSE <1000 mg/dL.  CBC     Status: Abnormal   Collection Time    05/07/13  4:43 AM      Result Value Ref Range   WBC 4.6  4.0 - 10.5 K/uL   RBC 3.46 (*) 3.87 - 5.11 MIL/uL   Hemoglobin 9.5 (*) 12.0 - 15.0 g/dL   HCT 28.9 (*) 36.0 - 46.0 %   MCV 83.5  78.0 - 100.0 fL   MCH 27.5  26.0 - 34.0 pg   MCHC 32.9  30.0 - 36.0 g/dL   RDW 14.4  11.5 - 15.5 %   Platelets 230  150 - 400 K/uL  COMPREHENSIVE METABOLIC PANEL     Status: Abnormal   Collection  Time    05/07/13  4:43 AM      Result Value Ref Range   Sodium 143  137 - 147 mEq/L   Potassium 4.5  3.7 - 5.3 mEq/L   Comment: DELTA CHECK  NOTED   Chloride 107  96 - 112 mEq/L   CO2 25  19 - 32 mEq/L   Glucose, Bld 94  70 - 99 mg/dL   BUN 16  6 - 23 mg/dL   Creatinine, Ser 0.74  0.50 - 1.10 mg/dL   Calcium 11.0 (*) 8.4 - 10.5 mg/dL   Total Protein 7.6  6.0 - 8.3 g/dL   Albumin 2.9 (*) 3.5 - 5.2 g/dL   AST 13  0 - 37 U/L   ALT <5  0 - 35 U/L   Alkaline Phosphatase 136 (*) 39 - 117 U/L   Total Bilirubin 0.3  0.3 - 1.2 mg/dL   GFR calc non Af Amer 78 (*) >90 mL/min   GFR calc Af Amer >90  >90 mL/min   Comment: (NOTE)     The eGFR has been calculated using the CKD EPI equation.     This calculation has not been validated in all clinical situations.     eGFR's persistently <90 mL/min signify possible Chronic Kidney     Disease.      RADIOGRAPHY: Dg Chest 2 View  05/06/2013   CLINICAL DATA:  Chest pain  EXAM: CHEST  2 VIEW  COMPARISON:  None.  FINDINGS: Cardiac shadow is within normal limits. Diffuse density is noted particularly in the right lateral lung base. Would be difficult to exclude an acute infiltrate. Some soft tissue prominence is noted to the right of the trachea. The mass lesion would be difficult to exclude as there is some soft tissue density in this region on the lateral projection although it this likely represents a tortuous right innominate artery as there is some calcification adjacent to the density. Diffuse osteopenia is noted. Some old rib fractures with healing are noted bilaterally. No acute bony abnormality is noted.  IMPRESSION: Likely chronic changes although an acute right basilar infiltrate cannot be totally excluded.  Density to the right of the trachea likely related to tortuous vascularity. Better position followup films may be helpful when the patient's condition improves.   Electronically Signed   By: Inez Catalina M.D.   On: 05/06/2013 19:36   Ct Head Wo Contrast  05/06/2013   CLINICAL DATA:  Weakness for 1 month  EXAM: CT HEAD WITHOUT CONTRAST  TECHNIQUE: Contiguous axial images were obtained  from the base of the skull through the vertex without intravenous contrast.  COMPARISON:  None.  FINDINGS: Mild prominence of the sulci, cisterns, and ventricles, in keeping with mild cerebral and cerebellar volume loss. Mild to moderate subcortical and periventricular white matter hypodensities, a nonspecific finding often seen in the setting of chronic microangiopathic change. No definite CT evidence of an acute infarction. No intraparenchymal hemorrhage, mass, mass effect, or abnormal extra-axial fluid collection. No hydrocephalus. Atherosclerotic vascular calcifications. The visualized paranasal sinuses and mastoid air cells are predominantly clear.  IMPRESSION: Volume loss and white matter changes as above. No definite CT evidence of acute intracranial abnormality.   Electronically Signed   By: Carlos Levering M.D.   On: 05/06/2013 21:11       PATHOLOGY:  None  ASSESSMENT:  1. Right sided inflammatory breast cancer, not biopsy proven at this time. 2. Hypercalcemia, corrected calcium of 11.88 3. Frail 4. Strong family  support system 5. HTN, on HCTZ which can cause hypercalcemia.  Patient Active Problem List   Diagnosis Date Noted  . Breast CA 05/06/2013  . Hypercalcemia 05/06/2013  . HTN (hypertension) 05/06/2013    PLAN:  1. I personally reviewed and went over laboratory results with the patient.  The results are noted within this dictation. 2. I personally reviewed and went over radiographic studies with the patient.  The results are noted within this dictation.   3. Chart is reviewed 4. Consider changing Microzide medication as this can cause hypercalcemia 5. Will administer Pamidronate 90 mg IV over 3 hours for hypercalcemia 6. Lab orders discontinued: CBC, BMET, Creatinine 7. New lab orders placed: CBC diff, CMET daily 8. Labs tomorrow AM: CA 27.29 and CEA 9. Accidentally D/C'd Lovenox 40 mg q24 hours.  Medication was re-ordered for tomorrow and daily thereafter.  10. Oncology  recommendations:  A. If patient/family is willing, recommend needle biopsy for confirmation of clinical diagnosis.  Recommend ER/PR, Her2/neu testing on sampled tissue.  Would also perform a skeletal survey to evaluate for bony involvement.  B. If patient/family is averse to chemotherapy and radiation, depending on ER/PR testing, she may be a candidate for oral anti-endocrine therapy for palliation (although this is not common in inflammatory breast cancer).  If ER/PR negative, then we can make other recommendations, but patient and family are presently not in support of radiation or chemotherapy.  Additionally, she is a frail woman and depending on her hospital course and performance status, she may not be a candidate for any further therapy (if ER/PR negative).   11.I will add our team to the treatment team to assist in patient care and help with decision making.  12. We will follow along with the rest of her team members.  All questions were answered. The patient knows to call the clinic with any problems, questions or concerns. We can certainly see the patient much sooner if necessary.  Patient and plan discussed with Dr. Farrel Gobble and he is in agreement with the aforementioned.    Octaviano Mukai 05/07/2013

## 2013-05-07 NOTE — Consult Note (Signed)
Reason for Consult: Right breast mass Referring Physician: Dr. Wyonia Hough is an 80 y.o. female.  HPI: Patient is an 80 year old black female who lives at home taken care of by members who presented to [facility] with hypercalcemia. She was on examination to have a right breast mass. Workup including CT scan of the head as well as chest x-ray were unremarkable for metastatic disease. Patient is somewhat a poor historian.  Past Medical History  Diagnosis Date  . Renal disorder     History reviewed. No pertinent past surgical history.  No family history on file.  Social History:  reports that she has never smoked. She does not have any smokeless tobacco history on file. She reports that she does not drink alcohol or use illicit drugs.  Allergies: No Known Allergies  Medications: I have reviewed the patient's current medications.  Results for orders placed during the hospital encounter of 05/06/13 (from the past 48 hour(s))  CBC WITH DIFFERENTIAL     Status: Abnormal   Collection Time    05/06/13  7:32 PM      Result Value Ref Range   WBC 4.8  4.0 - 10.5 K/uL   RBC 3.77 (*) 3.87 - 5.11 MIL/uL   Hemoglobin 10.3 (*) 12.0 - 15.0 g/dL   HCT 31.6 (*) 36.0 - 46.0 %   MCV 83.8  78.0 - 100.0 fL   MCH 27.3  26.0 - 34.0 pg   MCHC 32.6  30.0 - 36.0 g/dL   RDW 14.3  11.5 - 15.5 %   Platelets 243  150 - 400 K/uL   Neutrophils Relative % 54  43 - 77 %   Neutro Abs 2.5  1.7 - 7.7 K/uL   Lymphocytes Relative 32  12 - 46 %   Lymphs Abs 1.6  0.7 - 4.0 K/uL   Monocytes Relative 11  3 - 12 %   Monocytes Absolute 0.5  0.1 - 1.0 K/uL   Eosinophils Relative 3  0 - 5 %   Eosinophils Absolute 0.2  0.0 - 0.7 K/uL   Basophils Relative 0  0 - 1 %   Basophils Absolute 0.0  0.0 - 0.1 K/uL  COMPREHENSIVE METABOLIC PANEL     Status: Abnormal   Collection Time    05/06/13  7:32 PM      Result Value Ref Range   Sodium 141  137 - 147 mEq/L   Potassium 3.6 (*) 3.7 - 5.3 mEq/L    Chloride 101  96 - 112 mEq/L   CO2 25  19 - 32 mEq/L   Glucose, Bld 112 (*) 70 - 99 mg/dL   BUN 20  6 - 23 mg/dL   Creatinine, Ser 0.84  0.50 - 1.10 mg/dL   Calcium 11.6 (*) 8.4 - 10.5 mg/dL   Total Protein 8.6 (*) 6.0 - 8.3 g/dL   Albumin 3.2 (*) 3.5 - 5.2 g/dL   AST 14  0 - 37 U/L   ALT 6  0 - 35 U/L   Alkaline Phosphatase 154 (*) 39 - 117 U/L   Total Bilirubin 0.3  0.3 - 1.2 mg/dL   GFR calc non Af Amer 63 (*) >90 mL/min   GFR calc Af Amer 74 (*) >90 mL/min   Comment: (NOTE)     The eGFR has been calculated using the CKD EPI equation.     This calculation has not been validated in all clinical situations.     eGFR's persistently <  90 mL/min signify possible Chronic Kidney     Disease.  URINALYSIS, ROUTINE W REFLEX MICROSCOPIC     Status: None   Collection Time    05/06/13  7:44 PM      Result Value Ref Range   Color, Urine YELLOW  YELLOW   APPearance CLEAR  CLEAR   Specific Gravity, Urine 1.025  1.005 - 1.030   pH 5.5  5.0 - 8.0   Glucose, UA NEGATIVE  NEGATIVE mg/dL   Hgb urine dipstick NEGATIVE  NEGATIVE   Bilirubin Urine NEGATIVE  NEGATIVE   Ketones, ur NEGATIVE  NEGATIVE mg/dL   Protein, ur NEGATIVE  NEGATIVE mg/dL   Urobilinogen, UA 1.0  0.0 - 1.0 mg/dL   Nitrite NEGATIVE  NEGATIVE   Leukocytes, UA NEGATIVE  NEGATIVE   Comment: MICROSCOPIC NOT DONE ON URINES WITH NEGATIVE PROTEIN, BLOOD, LEUKOCYTES, NITRITE, OR GLUCOSE <1000 mg/dL.  CBC     Status: Abnormal   Collection Time    05/07/13  4:43 AM      Result Value Ref Range   WBC 4.6  4.0 - 10.5 K/uL   RBC 3.46 (*) 3.87 - 5.11 MIL/uL   Hemoglobin 9.5 (*) 12.0 - 15.0 g/dL   HCT 28.9 (*) 36.0 - 46.0 %   MCV 83.5  78.0 - 100.0 fL   MCH 27.5  26.0 - 34.0 pg   MCHC 32.9  30.0 - 36.0 g/dL   RDW 14.4  11.5 - 15.5 %   Platelets 230  150 - 400 K/uL  COMPREHENSIVE METABOLIC PANEL     Status: Abnormal   Collection Time    05/07/13  4:43 AM      Result Value Ref Range   Sodium 143  137 - 147 mEq/L   Potassium 4.5  3.7  - 5.3 mEq/L   Comment: DELTA CHECK NOTED   Chloride 107  96 - 112 mEq/L   CO2 25  19 - 32 mEq/L   Glucose, Bld 94  70 - 99 mg/dL   BUN 16  6 - 23 mg/dL   Creatinine, Ser 0.74  0.50 - 1.10 mg/dL   Calcium 11.0 (*) 8.4 - 10.5 mg/dL   Total Protein 7.6  6.0 - 8.3 g/dL   Albumin 2.9 (*) 3.5 - 5.2 g/dL   AST 13  0 - 37 U/L   ALT <5  0 - 35 U/L   Alkaline Phosphatase 136 (*) 39 - 117 U/L   Total Bilirubin 0.3  0.3 - 1.2 mg/dL   GFR calc non Af Amer 78 (*) >90 mL/min   GFR calc Af Amer >90  >90 mL/min   Comment: (NOTE)     The eGFR has been calculated using the CKD EPI equation.     This calculation has not been validated in all clinical situations.     eGFR's persistently <90 mL/min signify possible Chronic Kidney     Disease.    Dg Chest 2 View  05/06/2013   CLINICAL DATA:  Chest pain  EXAM: CHEST  2 VIEW  COMPARISON:  None.  FINDINGS: Cardiac shadow is within normal limits. Diffuse density is noted particularly in the right lateral lung base. Would be difficult to exclude an acute infiltrate. Some soft tissue prominence is noted to the right of the trachea. The mass lesion would be difficult to exclude as there is some soft tissue density in this region on the lateral projection although it this likely represents a tortuous right innominate artery as there  is some calcification adjacent to the density. Diffuse osteopenia is noted. Some old rib fractures with healing are noted bilaterally. No acute bony abnormality is noted.  IMPRESSION: Likely chronic changes although an acute right basilar infiltrate cannot be totally excluded.  Density to the right of the trachea likely related to tortuous vascularity. Better position followup films may be helpful when the patient's condition improves.   Electronically Signed   By: Inez Catalina M.D.   On: 05/06/2013 19:36   Ct Head Wo Contrast  05/06/2013   CLINICAL DATA:  Weakness for 1 month  EXAM: CT HEAD WITHOUT CONTRAST  TECHNIQUE: Contiguous axial  images were obtained from the base of the skull through the vertex without intravenous contrast.  COMPARISON:  None.  FINDINGS: Mild prominence of the sulci, cisterns, and ventricles, in keeping with mild cerebral and cerebellar volume loss. Mild to moderate subcortical and periventricular white matter hypodensities, a nonspecific finding often seen in the setting of chronic microangiopathic change. No definite CT evidence of an acute infarction. No intraparenchymal hemorrhage, mass, mass effect, or abnormal extra-axial fluid collection. No hydrocephalus. Atherosclerotic vascular calcifications. The visualized paranasal sinuses and mastoid air cells are predominantly clear.  IMPRESSION: Volume loss and white matter changes as above. No definite CT evidence of acute intracranial abnormality.   Electronically Signed   By: Carlos Levering M.D.   On: 05/06/2013 21:11    ROS: See chart Blood pressure 125/83, pulse 78, temperature 98.8 F (37.1 C), temperature source Oral, resp. rate 18, height 5' 2"  (1.575 m), weight 62.143 kg (137 lb), SpO2 94.00%. Physical Exam: Pleasant black female in no acute distress. Rectal examination reveals a hard indurated mass with skin erosion along the inner and inner lower quadrant of the right breast. The mass extends to the nipple. Peau d'orange skin changes are also noted. Large hard fixed mass is also noted in the right axilla. Left breast examination is unremarkable.  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 Plan: Had an extensive discussion with the family. They state that she may not want chemotherapy or radiation therapy. Should this be the case, surgical biopsy would not be warranted as it would not change her treatment plan. I will discuss with them over the weekend. She is not a candidate for mastectomy. Will follow with  you.  Kymberli Wiegand A 05/07/2013, 12:53 PM

## 2013-05-07 NOTE — Progress Notes (Signed)
UR chart review completed.  

## 2013-05-08 LAB — CBC WITH DIFFERENTIAL/PLATELET
BASOS ABS: 0 10*3/uL (ref 0.0–0.1)
Basophils Relative: 1 % (ref 0–1)
EOS ABS: 0.2 10*3/uL (ref 0.0–0.7)
EOS PCT: 6 % — AB (ref 0–5)
HCT: 28.6 % — ABNORMAL LOW (ref 36.0–46.0)
Hemoglobin: 9.3 g/dL — ABNORMAL LOW (ref 12.0–15.0)
Lymphocytes Relative: 34 % (ref 12–46)
Lymphs Abs: 1.2 10*3/uL (ref 0.7–4.0)
MCH: 27.4 pg (ref 26.0–34.0)
MCHC: 32.5 g/dL (ref 30.0–36.0)
MCV: 84.1 fL (ref 78.0–100.0)
Monocytes Absolute: 0.5 10*3/uL (ref 0.1–1.0)
Monocytes Relative: 14 % — ABNORMAL HIGH (ref 3–12)
Neutro Abs: 1.6 10*3/uL — ABNORMAL LOW (ref 1.7–7.7)
Neutrophils Relative %: 45 % (ref 43–77)
Platelets: 213 10*3/uL (ref 150–400)
RBC: 3.4 MIL/uL — AB (ref 3.87–5.11)
RDW: 14.4 % (ref 11.5–15.5)
WBC: 3.5 10*3/uL — AB (ref 4.0–10.5)

## 2013-05-08 LAB — COMPREHENSIVE METABOLIC PANEL
ALBUMIN: 2.7 g/dL — AB (ref 3.5–5.2)
ALK PHOS: 126 U/L — AB (ref 39–117)
AST: 12 U/L (ref 0–37)
BUN: 10 mg/dL (ref 6–23)
CALCIUM: 10.7 mg/dL — AB (ref 8.4–10.5)
CO2: 23 mEq/L (ref 19–32)
Chloride: 108 mEq/L (ref 96–112)
Creatinine, Ser: 0.72 mg/dL (ref 0.50–1.10)
GFR calc Af Amer: >90 mL/min (ref >90)
GFR calc non Af Amer: 78 mL/min — ABNORMAL LOW (ref >90)
Glucose, Bld: 73 mg/dL (ref 70–99)
POTASSIUM: 3.9 meq/L (ref 3.7–5.3)
Sodium: 142 mEq/L (ref 137–147)
Total Bilirubin: 0.4 mg/dL (ref 0.3–1.2)
Total Protein: 7.3 g/dL (ref 6.0–8.3)

## 2013-05-08 LAB — CANCER ANTIGEN 27.29: CA 27.29: 44 U/mL — ABNORMAL HIGH (ref 0–39)

## 2013-05-08 LAB — CEA: CEA: 1.2 ng/mL (ref 0.0–5.0)

## 2013-05-08 NOTE — Progress Notes (Signed)
Appreciate oncology input. Have temporarily schedule patient for right breast biopsy on 05/10/2013. Preoperative orders to follow.

## 2013-05-08 NOTE — Progress Notes (Signed)
Subjective: Patient is resting. She feels better. Her pain is controlled. She is planned for breast biosy. Objective: Vital signs in last 24 hours: Temp:  [97.4 F (36.3 C)-98.4 F (36.9 C)] 97.4 F (36.3 C) (03/14 0528) Pulse Rate:  [74-86] 82 (03/14 0528) Resp:  [16-18] 16 (03/14 0528) BP: (130-139)/(51-58) 139/51 mmHg (03/14 0528) SpO2:  [95 %-97 %] 97 % (03/14 0528) Weight change:  Last BM Date: 05/05/13  Intake/Output from previous day: 03/13 0701 - 03/14 0700 In: 360 [P.O.:360] Out: -   PHYSICAL EXAM General appearance: alert and no distress Resp: clear to auscultation bilaterally Cardio: S1, S2 normal GI: soft, non-tender; bowel sounds normal; no masses,  no organomegaly Extremities: extremities normal, atraumatic, no cyanosis or edema  Lab Results:    @labtest @ [facility] No results found for this basename: PHART, PCO2, PO2ART, TCO2, HCO3,  in the last 72 hours CULTURES No results found for this or any previous visit (from the past 240 hour(s)). Studies/Results: Dg Chest 2 View  05/06/2013   CLINICAL DATA:  Chest pain  EXAM: CHEST  2 VIEW  COMPARISON:  None.  FINDINGS: Cardiac shadow is within normal limits. Diffuse density is noted particularly in the right lateral lung base. Would be difficult to exclude an acute infiltrate. Some soft tissue prominence is noted to the right of the trachea. The mass lesion would be difficult to exclude as there is some soft tissue density in this region on the lateral projection although it this likely represents a tortuous right innominate artery as there is some calcification adjacent to the density. Diffuse osteopenia is noted. Some old rib fractures with healing are noted bilaterally. No acute bony abnormality is noted.  IMPRESSION: Likely chronic changes although an acute right basilar infiltrate cannot be totally excluded.  Density to the right of the trachea likely related to tortuous vascularity. Better position followup films may be  helpful when the patient's condition improves.   Electronically Signed   By: Inez Catalina M.D.   On: 05/06/2013 19:36   Ct Head Wo Contrast  05/06/2013   CLINICAL DATA:  Weakness for 1 month  EXAM: CT HEAD WITHOUT CONTRAST  TECHNIQUE: Contiguous axial images were obtained from the base of the skull through the vertex without intravenous contrast.  COMPARISON:  None.  FINDINGS: Mild prominence of the sulci, cisterns, and ventricles, in keeping with mild cerebral and cerebellar volume loss. Mild to moderate subcortical and periventricular white matter hypodensities, a nonspecific finding often seen in the setting of chronic microangiopathic change. No definite CT evidence of an acute infarction. No intraparenchymal hemorrhage, mass, mass effect, or abnormal extra-axial fluid collection. No hydrocephalus. Atherosclerotic vascular calcifications. The visualized paranasal sinuses and mastoid air cells are predominantly clear.  IMPRESSION: Volume loss and white matter changes as above. No definite CT evidence of acute intracranial abnormality.   Electronically Signed   By: Carlos Levering M.D.   On: 05/06/2013 21:11    Medications: I have reviewed the patient's current medications.  Assesment: Principal Problem:   Hypercalcemia Active Problems:   Breast CA   HTN (hypertension)    Plan: Will increase IV fluid to 100cc/hr Will monitor CBC and BMP Oncology and surgery consult appreciated   LOS: 2 days   Dazani Norby 05/08/2013, 9:04 AM

## 2013-05-09 LAB — CBC WITH DIFFERENTIAL/PLATELET
Basophils Absolute: 0 10*3/uL (ref 0.0–0.1)
Basophils Relative: 1 % (ref 0–1)
EOS PCT: 6 % — AB (ref 0–5)
Eosinophils Absolute: 0.2 10*3/uL (ref 0.0–0.7)
HCT: 26.7 % — ABNORMAL LOW (ref 36.0–46.0)
HEMOGLOBIN: 8.9 g/dL — AB (ref 12.0–15.0)
LYMPHS ABS: 0.9 10*3/uL (ref 0.7–4.0)
Lymphocytes Relative: 30 % (ref 12–46)
MCH: 27.8 pg (ref 26.0–34.0)
MCHC: 33.3 g/dL (ref 30.0–36.0)
MCV: 83.4 fL (ref 78.0–100.0)
MONOS PCT: 17 % — AB (ref 3–12)
Monocytes Absolute: 0.6 10*3/uL (ref 0.1–1.0)
Neutro Abs: 1.5 10*3/uL — ABNORMAL LOW (ref 1.7–7.7)
Neutrophils Relative %: 46 % (ref 43–77)
Platelets: 192 10*3/uL (ref 150–400)
RBC: 3.2 MIL/uL — AB (ref 3.87–5.11)
RDW: 14.3 % (ref 11.5–15.5)
WBC: 3.2 10*3/uL — AB (ref 4.0–10.5)

## 2013-05-09 LAB — COMPREHENSIVE METABOLIC PANEL
ALK PHOS: 125 U/L — AB (ref 39–117)
ALT: <5 U/L (ref 0–35)
AST: 13 U/L (ref 0–37)
Albumin: 2.5 g/dL — ABNORMAL LOW (ref 3.5–5.2)
BUN: 7 mg/dL (ref 6–23)
CALCIUM: 9.4 mg/dL (ref 8.4–10.5)
CO2: 23 mEq/L (ref 19–32)
Chloride: 106 mEq/L (ref 96–112)
Creatinine, Ser: 0.69 mg/dL (ref 0.50–1.10)
GFR, EST NON AFRICAN AMERICAN: 79 mL/min — AB (ref >90)
GLUCOSE: 77 mg/dL (ref 70–99)
Potassium: 3.6 mEq/L — ABNORMAL LOW (ref 3.7–5.3)
SODIUM: 140 meq/L (ref 137–147)
Total Bilirubin: 0.3 mg/dL (ref 0.3–1.2)
Total Protein: 6.9 g/dL (ref 6.0–8.3)

## 2013-05-09 LAB — ABO/RH: ABO/RH(D): B POS

## 2013-05-09 LAB — PREPARE RBC (CROSSMATCH)

## 2013-05-09 MED ORDER — CHLORHEXIDINE GLUCONATE 4 % EX LIQD
1.0000 "application " | Freq: Once | CUTANEOUS | Status: AC
  Administered 2013-05-10: 1 via TOPICAL
  Filled 2013-05-09: qty 15

## 2013-05-09 NOTE — Progress Notes (Signed)
Subjective: Patient is resting. He pain is controlled. Objective: Vital signs in last 24 hours: Temp:  [97.9 F (36.6 C)-98.3 F (36.8 C)] 98.3 F (36.8 C) (03/15 0555) Pulse Rate:  [73-75] 75 (03/15 0555) Resp:  [16] 16 (03/15 0555) BP: (128-130)/(48-65) 128/48 mmHg (03/15 0555) SpO2:  [94 %-96 %] 96 % (03/15 0555) Weight change:  Last BM Date: 05/05/13  Intake/Output from previous day: 03/14 0701 - 03/15 0700 In: 1800 [P.O.:600; I.V.:1200] Out: -   PHYSICAL EXAM General appearance: alert and no distress Resp: clear to auscultation bilaterally Cardio: S1, S2 normal GI: soft, non-tender; bowel sounds normal; no masses,  no organomegaly Extremities: extremities normal, atraumatic, no cyanosis or edema  Lab Results:    @labtest @ [facility] No results found for this basename: PHART, PCO2, PO2ART, TCO2, HCO3,  in the last 72 hours CULTURES No results found for this or any previous visit (from the past 240 hour(s)). Studies/Results: No results found.  Medications: I have reviewed the patient's current medications.  Assesment: Principal Problem:   Hypercalcemia Active Problems:   Breast CA   HTN (hypertension)    Plan: Will decrease iv fluid to 50 cc/hr Continue pain control Scheduled for biopsy  LOS: 3 days   Salwa Bai 05/09/2013, 8:45 AM

## 2013-05-09 NOTE — Progress Notes (Signed)
Will proceed with right breast biopsy tomorrow. Given her anemia and need for surgery, we'll transfuse one unit packed red blood cells. This has been explained to the patient and family. Risks and benefits of the procedure were fully explained to the patient, who gave informed consent.

## 2013-05-10 ENCOUNTER — Inpatient Hospital Stay (HOSPITAL_COMMUNITY): Payer: Medicare Other | Admitting: Anesthesiology

## 2013-05-10 ENCOUNTER — Encounter (HOSPITAL_COMMUNITY): Payer: Medicare Other | Admitting: Anesthesiology

## 2013-05-10 ENCOUNTER — Encounter (HOSPITAL_COMMUNITY): Payer: Self-pay | Admitting: *Deleted

## 2013-05-10 ENCOUNTER — Encounter (HOSPITAL_COMMUNITY)
Admission: EM | Disposition: A | Discharge status: Home Health | Payer: Self-pay | Source: Home / Self Care | Attending: Internal Medicine

## 2013-05-10 DIAGNOSIS — I10 Essential (primary) hypertension: Secondary | ICD-10-CM | POA: Diagnosis not present

## 2013-05-10 DIAGNOSIS — C50919 Malignant neoplasm of unspecified site of unspecified female breast: Secondary | ICD-10-CM | POA: Diagnosis not present

## 2013-05-10 HISTORY — PX: BREAST BIOPSY: SHX20

## 2013-05-10 LAB — CBC WITH DIFFERENTIAL/PLATELET
Basophils Absolute: 0 10*3/uL (ref 0.0–0.1)
Basophils Relative: 1 % (ref 0–1)
Eosinophils Absolute: 0.2 10*3/uL (ref 0.0–0.7)
Eosinophils Relative: 5 % (ref 0–5)
HCT: 29.6 % — ABNORMAL LOW (ref 36.0–46.0)
HEMOGLOBIN: 10 g/dL — AB (ref 12.0–15.0)
LYMPHS ABS: 1.2 10*3/uL (ref 0.7–4.0)
LYMPHS PCT: 28 % (ref 12–46)
MCH: 28.1 pg (ref 26.0–34.0)
MCHC: 33.8 g/dL (ref 30.0–36.0)
MCV: 83.1 fL (ref 78.0–100.0)
MONOS PCT: 15 % — AB (ref 3–12)
Monocytes Absolute: 0.6 10*3/uL (ref 0.1–1.0)
NEUTROS PCT: 52 % (ref 43–77)
Neutro Abs: 2.3 10*3/uL (ref 1.7–7.7)
Platelets: 181 10*3/uL (ref 150–400)
RBC: 3.56 MIL/uL — AB (ref 3.87–5.11)
RDW: 14.6 % (ref 11.5–15.5)
WBC: 4.4 10*3/uL (ref 4.0–10.5)

## 2013-05-10 LAB — TYPE AND SCREEN
ABO/RH(D): B POS
ANTIBODY SCREEN: NEGATIVE
Unit division: 0

## 2013-05-10 LAB — COMPREHENSIVE METABOLIC PANEL
ALT: 5 U/L (ref 0–35)
AST: 15 U/L (ref 0–37)
Albumin: 2.4 g/dL — ABNORMAL LOW (ref 3.5–5.2)
Alkaline Phosphatase: 125 U/L — ABNORMAL HIGH (ref 39–117)
BILIRUBIN TOTAL: 0.4 mg/dL (ref 0.3–1.2)
BUN: 6 mg/dL (ref 6–23)
CHLORIDE: 109 meq/L (ref 96–112)
CO2: 23 meq/L (ref 19–32)
Calcium: 8.5 mg/dL (ref 8.4–10.5)
Creatinine, Ser: 0.7 mg/dL (ref 0.50–1.10)
GFR calc Af Amer: >90 mL/min (ref >90)
GFR calc non Af Amer: 79 mL/min — ABNORMAL LOW (ref >90)
Glucose, Bld: 79 mg/dL (ref 70–99)
Potassium: 3.4 mEq/L — ABNORMAL LOW (ref 3.7–5.3)
Sodium: 142 mEq/L (ref 137–147)
Total Protein: 6.8 g/dL (ref 6.0–8.3)

## 2013-05-10 LAB — SURGICAL PCR SCREEN
MRSA, PCR: NEGATIVE
Staphylococcus aureus: NEGATIVE

## 2013-05-10 SURGERY — BREAST BIOPSY
Anesthesia: Monitor Anesthesia Care | Laterality: Right

## 2013-05-10 MED ORDER — MIDAZOLAM HCL 2 MG/2ML IJ SOLN
1.0000 mg | INTRAMUSCULAR | Status: DC | PRN
  Administered 2013-05-10: 2 mg via INTRAVENOUS

## 2013-05-10 MED ORDER — FENTANYL CITRATE 0.05 MG/ML IJ SOLN
25.0000 ug | INTRAMUSCULAR | Status: DC
  Administered 2013-05-10: 25 ug via INTRAVENOUS

## 2013-05-10 MED ORDER — PROPOFOL 10 MG/ML IV BOLUS
INTRAVENOUS | Status: AC
  Filled 2013-05-10: qty 20

## 2013-05-10 MED ORDER — PROPOFOL INFUSION 10 MG/ML OPTIME
INTRAVENOUS | Status: DC | PRN
  Administered 2013-05-10: 50 ug/kg/min via INTRAVENOUS

## 2013-05-10 MED ORDER — LACTATED RINGERS IV SOLN
INTRAVENOUS | Status: DC | PRN
  Administered 2013-05-10: 13:00:00 via INTRAVENOUS

## 2013-05-10 MED ORDER — MIDAZOLAM HCL 2 MG/2ML IJ SOLN
INTRAMUSCULAR | Status: AC
  Filled 2013-05-10: qty 2

## 2013-05-10 MED ORDER — FENTANYL CITRATE 0.05 MG/ML IJ SOLN: 25.0000 ug | INTRAMUSCULAR | Status: DC | PRN

## 2013-05-10 MED ORDER — LIDOCAINE HCL (PF) 1 % IJ SOLN
INTRAMUSCULAR | Status: DC | PRN
  Administered 2013-05-10: 1 mL

## 2013-05-10 MED ORDER — ONDANSETRON HCL 4 MG/2ML IJ SOLN
INTRAMUSCULAR | Status: AC
  Filled 2013-05-10: qty 2

## 2013-05-10 MED ORDER — ONDANSETRON HCL 4 MG/2ML IJ SOLN: 4.0000 mg | Freq: Once | INTRAMUSCULAR | Status: DC | PRN

## 2013-05-10 MED ORDER — LIDOCAINE HCL (PF) 1 % IJ SOLN
INTRAMUSCULAR | Status: AC
  Filled 2013-05-10: qty 30

## 2013-05-10 MED ORDER — POTASSIUM CHLORIDE CRYS ER 20 MEQ PO TBCR
40.0000 meq | EXTENDED_RELEASE_TABLET | Freq: Two times a day (BID) | ORAL | Status: DC
  Administered 2013-05-10 – 2013-05-11 (×3): 40 meq via ORAL
  Filled 2013-05-10 (×4): qty 2

## 2013-05-10 MED ORDER — FENTANYL CITRATE 0.05 MG/ML IJ SOLN
INTRAMUSCULAR | Status: AC
  Filled 2013-05-10: qty 2

## 2013-05-10 MED ORDER — LACTATED RINGERS IV SOLN
INTRAVENOUS | Status: DC
  Administered 2013-05-10: 13:00:00 via INTRAVENOUS

## 2013-05-10 MED ORDER — BUPIVACAINE HCL (PF) 0.5 % IJ SOLN
INTRAMUSCULAR | Status: AC
  Filled 2013-05-10: qty 30

## 2013-05-10 MED ORDER — 0.9 % SODIUM CHLORIDE (POUR BTL) OPTIME
TOPICAL | Status: DC | PRN
  Administered 2013-05-10: 1000 mL

## 2013-05-10 SURGICAL SUPPLY — 35 items
BAG HAMPER (MISCELLANEOUS) ×3 IMPLANT
BLADE SURG 15 STRL LF DISP TIS (BLADE) ×1 IMPLANT
BLADE SURG 15 STRL SS (BLADE) ×2
CLOSURE WOUND 1/4 X3 (GAUZE/BANDAGES/DRESSINGS)
CLOTH BEACON ORANGE TIMEOUT ST (SAFETY) ×3 IMPLANT
COVER LIGHT HANDLE STERIS (MISCELLANEOUS) ×6 IMPLANT
DERMABOND ADVANCED (GAUZE/BANDAGES/DRESSINGS) ×2
DERMABOND ADVANCED .7 DNX12 (GAUZE/BANDAGES/DRESSINGS) ×1 IMPLANT
DURAPREP 26ML APPLICATOR (WOUND CARE) ×3 IMPLANT
ELECT REM PT RETURN 9FT ADLT (ELECTROSURGICAL) ×3
ELECTRODE REM PT RTRN 9FT ADLT (ELECTROSURGICAL) ×1 IMPLANT
FORMALIN 10 PREFIL 120ML (MISCELLANEOUS) ×3 IMPLANT
GLOVE BIO SURGEON STRL SZ7.5 (GLOVE) ×3 IMPLANT
GLOVE BIOGEL PI IND STRL 7.0 (GLOVE) ×1 IMPLANT
GLOVE BIOGEL PI IND STRL 7.5 (GLOVE) ×1 IMPLANT
GLOVE BIOGEL PI INDICATOR 7.0 (GLOVE) ×2
GLOVE BIOGEL PI INDICATOR 7.5 (GLOVE) ×2
GLOVE ECLIPSE 7.0 STRL STRAW (GLOVE) ×3 IMPLANT
GOWN STRL REUS W/TWL LRG LVL3 (GOWN DISPOSABLE) ×6 IMPLANT
KIT ROOM TURNOVER APOR (KITS) ×3 IMPLANT
MANIFOLD NEPTUNE II (INSTRUMENTS) ×3 IMPLANT
NEEDLE HYPO 18GX1.5 BLUNT FILL (NEEDLE) ×3 IMPLANT
NEEDLE HYPO 25X1 1.5 SAFETY (NEEDLE) ×3 IMPLANT
NS IRRIG 1000ML POUR BTL (IV SOLUTION) ×3 IMPLANT
PACK MINOR (CUSTOM PROCEDURE TRAY) ×3 IMPLANT
PAD ARMBOARD 7.5X6 YLW CONV (MISCELLANEOUS) ×3 IMPLANT
SET BASIN LINEN APH (SET/KITS/TRAYS/PACK) ×3 IMPLANT
SPONGE GAUZE 2X2 8PLY STER LF (GAUZE/BANDAGES/DRESSINGS)
SPONGE GAUZE 2X2 8PLY STRL LF (GAUZE/BANDAGES/DRESSINGS) IMPLANT
STRIP CLOSURE SKIN 1/4X3 (GAUZE/BANDAGES/DRESSINGS) IMPLANT
SUT SILK 2 0 SH (SUTURE) ×3 IMPLANT
SUT VIC AB 3-0 SH 27 (SUTURE) ×2
SUT VIC AB 3-0 SH 27X BRD (SUTURE) ×1 IMPLANT
SUT VIC AB 4-0 PS2 27 (SUTURE) ×3 IMPLANT
SYR CONTROL 10ML LL (SYRINGE) ×3 IMPLANT

## 2013-05-10 NOTE — Preoperative (Signed)
Beta Blockers   Reason not to administer Beta Blockers:Not Applicable 

## 2013-05-10 NOTE — Anesthesia Postprocedure Evaluation (Signed)
  Anesthesia Post-op Note  Patient: Kelli Lopez  Procedure(s) Performed: Procedure(s): BREAST BIOPSY (Right)  Patient Location: PACU  Anesthesia Type:MAC  Level of Consciousness: awake, alert , oriented and patient cooperative  Airway and Oxygen Therapy: Patient Spontanous Breathing and Patient connected to nasal cannula oxygen  Post-op Pain: none  Post-op Assessment: Post-op Vital signs reviewed, Patient's Cardiovascular Status Stable, Respiratory Function Stable, Patent Airway, No signs of Nausea or vomiting, Adequate PO intake, Pain level controlled, No headache, No backache, No residual numbness and No residual motor weakness  Post-op Vital Signs: Reviewed and stable  Complications: No apparent anesthesia complications

## 2013-05-10 NOTE — Op Note (Signed)
Patient:  Kelli Lopez  DOB:  09-Jul-1929  MRN:  [id number]   Preop Diagnosis:  Right breast carcinoma  Postop Diagnosis:  Same  Procedure:  Right breast biopsy  Surgeon:  Aviva Signs, M.D.  Anes:  MAC  Indications:  Patient is an 79 year old black female who presented with hypercalcemia [facility] hospital she was found to have a large right indicating breast mass with obvious metastatic disease to the right axilla. Some erosion through the skin is noted. The patient now comes the operating room for right breast biopsy for tumor markers. The risks and benefits of the procedure were fully explained to the patient and family, who gave informed consent. They realize that this is for palliation only.  Procedure note:  The patient is placed the supine position. After monitored anesthesia care was given, the right breast was prepped and draped using usual sterile technique with Betadine. Surgical site confirmation was performed. 1% Xylocaine was used for local anesthesia.  An elliptical incision was made in the upper, inner quadrant of the right breast close to the areola. The dissection was taken down to the mass. A right breast biopsy was then performed. The tissue was sent to pathology for further examination. A bleeding was controlled using Bovie electrocautery. The skin was reapproximated using 4-0 Vicryl subcuticular suture. Dermabond was then applied.  All tape and needle counts were correct at the end of the procedure. Patient was transferred to PACU in stable condition.  Complications:  None  EBL:  Minimal  Specimen:  Right breast tissue

## 2013-05-10 NOTE — Progress Notes (Signed)
Subjective: Patient is resting. She is scheduled for biopsy today. Objective: Vital signs in last 24 hours: Temp:  [97.5 F (36.4 C)-98.8 F (37.1 C)] 98.8 F (37.1 C) (03/16 0548) Pulse Rate:  [66-82] 66 (03/16 0548) Resp:  [16-18] 16 (03/16 0548) BP: (108-150)/(44-64) 110/47 mmHg (03/16 0548) SpO2:  [94 %-96 %] 96 % (03/16 0548) Weight change:  Last BM Date: 05/05/13  Intake/Output from previous day: 03/15 0701 - 03/16 0700 In: 710 [P.O.:360; Blood:350] Out: -   PHYSICAL EXAM General appearance: alert and no distress Resp: clear to auscultation bilaterally Cardio: S1, S2 normal GI: soft, non-tender; bowel sounds normal; no masses,  no organomegaly Extremities: extremities normal, atraumatic, no cyanosis or edema  Lab Results:    @labtest @ [facility] No results found for this basename: PHART, PCO2, PO2ART, TCO2, HCO3,  in the last 72 hours CULTURES Recent Results (from the past 240 hour(s))  SURGICAL PCR SCREEN     Status: None   Collection Time    05/09/13 11:00 PM      Result Value Ref Range Status   MRSA, PCR NEGATIVE  NEGATIVE Final   Staphylococcus aureus NEGATIVE  NEGATIVE Final   Comment:            The Xpert SA Assay (FDA     approved for NASAL specimens     in patients over 41 years of age),     is one component of     a comprehensive surveillance     program.  Test performance has     been validated by [employer] for patients greater     than or equal to 83 year old.     It is not intended     to diagnose infection nor to     guide or monitor treatment.   Studies/Results: No results found.  Medications: I have reviewed the patient's current medications.  Assesment: Principal Problem:   Hypercalcemia Active Problems:   Breast CA   HTN (hypertension)    Plan: Will decrease iv fluid to 50 cc/hr Continue pain control Scheduled for biopsy  LOS: 4 days   Christe Tellez 05/10/2013, 8:17 AM

## 2013-05-10 NOTE — Transfer of Care (Signed)
Immediate Anesthesia Transfer of Care Note  Patient: Kelli Lopez  Procedure(s) Performed: Procedure(s): BREAST BIOPSY (Right)  Patient Location: PACU  Anesthesia Type:MAC  Level of Consciousness: awake, alert , oriented and patient cooperative  Airway & Oxygen Therapy: Patient Spontanous Breathing and Patient connected to nasal cannula oxygen  Post-op Assessment: Report given to PACU RN and Post -op Vital signs reviewed and stable  Post vital signs: Reviewed and stable  Complications: No apparent anesthesia complications

## 2013-05-10 NOTE — Care Management Note (Addendum)
    Page 1 of 2   05/11/2013     8:50:29 AM   CARE MANAGEMENT NOTE 05/11/2013  Patient:  Kelli Lopez, Kelli Lopez   Account Number:  [id number]  Date Initiated:  05/10/2013  Documentation initiated by:  Theophilus Kinds  Subjective/Objective Assessment:   Pt admitted from home with hypercalcemia. Pt lives at home and has 2 daughters that stay with pt. Pt unable to walk at this time. Pt will return home with family providing around the clock care.     Action/Plan:   Family is interested in [facility] RN, PT, and aide. Pt will also need BSC at discharge. Will arrange [facility] with agency of choice.   Anticipated DC Date:  05/12/2013   Anticipated DC Plan:  [place]  CM consult      PAC Choice  [place]   Choice offered to / List presented to:  C-4 Adult Children   DME arranged  [place]      DME agency  [place] arranged  HH-1 RN  [place].   Status of service:  Completed, signed off Medicare Important Message given?  YES (If response is "NO", the following Medicare IM given date fields will be blank) Date Medicare IM given:  05/11/2013 Date Additional Medicare IM given:    Discharge Disposition:  [place]  Per UR Regulation:    If discussed at Long Length of Stay Meetings, dates discussed:    Comments:  05/11/13 0845 Christinia Gully, RN BSN CM Pt discharged home today with [facility] RN, PT, and aide (per pts daughter choice). Romualdo Bolk of [facility] is aware and will collect the pts information from the chart. Hh services to start within 48 hours of discharge. DMe will be dropped shipped to pts home at discharge from [facility]. Pts daughter and pts nurse aware of discharge arrangements.  05/10/13 [place], RN BSN CM

## 2013-05-10 NOTE — Anesthesia Preprocedure Evaluation (Signed)
Anesthesia Evaluation  Patient identified by MRN, date of birth, ID band Patient awake    Reviewed: Allergy & Precautions, H&P , NPO status , Patient's Chart, lab work & pertinent test results  Airway Mallampati: III TM Distance: >3 FB Neck ROM: Full    Dental  (+) Edentulous Upper, Poor Dentition, Missing   Pulmonary neg pulmonary ROS,  breath sounds clear to auscultation        Cardiovascular hypertension, Pt. on medications Rhythm:Regular Rate:Normal     Neuro/Psych    GI/Hepatic   Endo/Other  diabetes, Type 2, Oral Hypoglycemic Agents  Renal/GU      Musculoskeletal   Abdominal   Peds  Hematology   Anesthesia Other Findings   Reproductive/Obstetrics                           Anesthesia Physical Anesthesia Plan  ASA: III  Anesthesia Plan: MAC   Post-op Pain Management:    Induction: Intravenous  Airway Management Planned: Simple Face Mask  Additional Equipment:   Intra-op Plan:   Post-operative Plan:   Informed Consent: I have reviewed the patients History and Physical, chart, labs and discussed the procedure including the risks, benefits and alternatives for the proposed anesthesia with the patient or authorized representative who has indicated his/her understanding and acceptance.     Plan Discussed with:   Anesthesia Plan Comments:         Anesthesia Quick Evaluation

## 2013-05-11 LAB — CBC WITH DIFFERENTIAL/PLATELET
BASOS PCT: 0 % (ref 0–1)
Basophils Absolute: 0 10*3/uL (ref 0.0–0.1)
EOS ABS: 0.3 10*3/uL (ref 0.0–0.7)
Eosinophils Relative: 5 % (ref 0–5)
HCT: 30.7 % — ABNORMAL LOW (ref 36.0–46.0)
HEMOGLOBIN: 10.1 g/dL — AB (ref 12.0–15.0)
Lymphocytes Relative: 25 % (ref 12–46)
Lymphs Abs: 1.3 10*3/uL (ref 0.7–4.0)
MCH: 27.5 pg (ref 26.0–34.0)
MCHC: 32.9 g/dL (ref 30.0–36.0)
MCV: 83.7 fL (ref 78.0–100.0)
MONOS PCT: 14 % — AB (ref 3–12)
Monocytes Absolute: 0.7 10*3/uL (ref 0.1–1.0)
NEUTROS PCT: 55 % (ref 43–77)
Neutro Abs: 3 10*3/uL (ref 1.7–7.7)
PLATELETS: 189 10*3/uL (ref 150–400)
RBC: 3.67 MIL/uL — ABNORMAL LOW (ref 3.87–5.11)
RDW: 14.9 % (ref 11.5–15.5)
WBC: 5.3 10*3/uL (ref 4.0–10.5)

## 2013-05-11 LAB — COMPREHENSIVE METABOLIC PANEL
ALBUMIN: 2.4 g/dL — AB (ref 3.5–5.2)
ALK PHOS: 123 U/L — AB (ref 39–117)
ALT: 5 U/L (ref 0–35)
AST: 14 U/L (ref 0–37)
BUN: 8 mg/dL (ref 6–23)
CO2: 22 mEq/L (ref 19–32)
CREATININE: 0.68 mg/dL (ref 0.50–1.10)
Calcium: 8.2 mg/dL — ABNORMAL LOW (ref 8.4–10.5)
Chloride: 110 mEq/L (ref 96–112)
GFR calc Af Amer: >90 mL/min (ref >90)
GFR calc non Af Amer: 80 mL/min — ABNORMAL LOW (ref >90)
Glucose, Bld: 81 mg/dL (ref 70–99)
Potassium: 4.3 mEq/L (ref 3.7–5.3)
Sodium: 142 mEq/L (ref 137–147)
Total Bilirubin: 0.4 mg/dL (ref 0.3–1.2)
Total Protein: 6.7 g/dL (ref 6.0–8.3)

## 2013-05-11 MED ORDER — ALPRAZOLAM ER 0.5 MG PO TB24: 0.5000 mg | ORAL_TABLET | Freq: Two times a day (BID) | ORAL | Status: AC

## 2013-05-11 NOTE — Discharge Summary (Signed)
Physician Discharge Summary  Patient ID: Kelli Lopez MRN: [id number] DOB/AGE: [birth year] 80 y.o. Primary Care Physician:No primary provider on file. Admit date: 05/06/2013 Discharge date: 05/11/2013    Discharge Diagnoses:   Principal Problem:   Hypercalcemia Active Problems:   Breast CA   HTN (hypertension)     Medication List         ALPRAZolam 0.5 MG 24 hr tablet  Commonly known as:  XANAX XR  Take 1 tablet (0.5 mg total) by mouth 2 times daily at 12 noon and 4 pm.     amLODipine 5 MG tablet  Commonly known as:  NORVASC  Take 5 mg by mouth daily.     HYDROcodone-acetaminophen 5-325 MG per tablet  Commonly known as:  NORCO/VICODIN  Take 1 tablet by mouth 2 (two) times daily as needed. For pain     losartan-hydrochlorothiazide 50-12.5 MG per tablet  Commonly known as:  HYZAAR  Take 1 tablet by mouth every evening.     metFORMIN 500 MG tablet  Commonly known as:  GLUCOPHAGE  Take 500 mg by mouth daily. Prescribed one tablet twice daily but only takes once daily     Vitamin D (Ergocalciferol) 50000 UNITS Caps capsule  Commonly known as:  DRISDOL  Take 50,000 Units by mouth every 7 (seven) days.        Discharged Condition: stable    Consults: surgery and oncology  Significant Diagnostic Studies: Dg Chest 2 View  05/06/2013   CLINICAL DATA:  Chest pain  EXAM: CHEST  2 VIEW  COMPARISON:  None.  FINDINGS: Cardiac shadow is within normal limits. Diffuse density is noted particularly in the right lateral lung base. Would be difficult to exclude an acute infiltrate. Some soft tissue prominence is noted to the right of the trachea. The mass lesion would be difficult to exclude as there is some soft tissue density in this region on the lateral projection although it this likely represents a tortuous right innominate artery as there is some calcification adjacent to the density. Diffuse osteopenia is noted. Some old rib fractures with healing are noted bilaterally.  No acute bony abnormality is noted.  IMPRESSION: Likely chronic changes although an acute right basilar infiltrate cannot be totally excluded.  Density to the right of the trachea likely related to tortuous vascularity. Better position followup films may be helpful when the patient's condition improves.   Electronically Signed   By: Inez Catalina M.D.   On: 05/06/2013 19:36   Ct Head Wo Contrast  05/06/2013   CLINICAL DATA:  Weakness for 1 month  EXAM: CT HEAD WITHOUT CONTRAST  TECHNIQUE: Contiguous axial images were obtained from the base of the skull through the vertex without intravenous contrast.  COMPARISON:  None.  FINDINGS: Mild prominence of the sulci, cisterns, and ventricles, in keeping with mild cerebral and cerebellar volume loss. Mild to moderate subcortical and periventricular white matter hypodensities, a nonspecific finding often seen in the setting of chronic microangiopathic change. No definite CT evidence of an acute infarction. No intraparenchymal hemorrhage, mass, mass effect, or abnormal extra-axial fluid collection. No hydrocephalus. Atherosclerotic vascular calcifications. The visualized paranasal sinuses and mastoid air cells are predominantly clear.  IMPRESSION: Volume loss and white matter changes as above. No definite CT evidence of acute intracranial abnormality.   Electronically Signed   By: Carlos Levering M.D.   On: 05/06/2013 21:11    Lab Results: Basic Metabolic Panel:  Recent Labs  05/10/13 0452 05/11/13 0449  NA 142  142  K 3.4* 4.3  CL 109 110  CO2 23 22  GLUCOSE 79 81  BUN 6 8  CREATININE 0.70 0.68  CALCIUM 8.5 8.2*   Liver Function Tests:  Recent Labs  05/10/13 0452 05/11/13 0449  AST 15 14  ALT 5 5  ALKPHOS 125* 123*  BILITOT 0.4 0.4  PROT 6.8 6.7  ALBUMIN 2.4* 2.4*     CBC:  Recent Labs  05/10/13 0452 05/11/13 0449  WBC 4.4 5.3  NEUTROABS 2.3 3.0  HGB 10.0* 10.1*  HCT 29.6* 30.7*  MCV 83.1 83.7  PLT 181 189    Recent Results  (from the past 240 hour(s))  SURGICAL PCR SCREEN     Status: None   Collection Time    05/09/13 11:00 PM      Result Value Ref Range Status   MRSA, PCR NEGATIVE  NEGATIVE Final   Staphylococcus aureus NEGATIVE  NEGATIVE Final   Comment:            The Xpert SA Assay (FDA     approved for NASAL specimens     in patients over 64 years of age),     is one component of     a comprehensive surveillance     program.  Test performance has     been validated by [employer] for patients greater     than or equal to 17 year old.     It is not intended     to diagnose infection nor to     guide or monitor treatment.     Hospital Course:  This is an 80 years old female with history of multiple medical illnesses was admitted due generalized weakness. She was found to have hypercalcemia and breast mass. Surgical consult was done and had biopsy. The biopsy result is pending. Patient was seen by oncology and planned for out patient follow up. Patient improved and discharged in stable condition.   Discharge Exam: Blood pressure 102/61, pulse 70, temperature 98.6 F (37 C), temperature source Oral, resp. rate 20, height 5\' 2"  (1.575 m), weight 137 lb (62.143 kg), SpO2 91.00%.   Disposition:  Home.        Follow-up Information   Follow up with [facility] In 1 week.   Contact information:   [address] [place] [address]       Signed: Rosita Fire   05/11/2013, 8:32 AM

## 2013-05-11 NOTE — Progress Notes (Signed)
UR chart review completed.  

## 2013-05-11 NOTE — Evaluation (Signed)
Physical Therapy Evaluation Patient Details Name: Kelli Lopez MRN: [id number] DOB: [birth year] Today's Date: 05/11/2013 Time: 9924-2683 PT Time Calculation (min): 23 min  PT Assessment / Plan / Recommendation History of Present Illness  Pt was admitted with hypercalcemia.  She is clinically found to have breast CA and is currently undergoing tests to confirm this.  She lives alone but has family rotating caregiving so she has 24 hour care.  Family states that pt became so weak last week that she was unable to walk. Prior to this she was able to walk in the home independently.  Clinical Impression   Pt was seen for evaluation.  She was alert and oriented, very cooperative.  She is deconditioned with decreased standing balance.  She needed only SBA for transfers but did need HHA or walker assist for gait due to posterior leaning.  She would benefit from HHPT .    PT Assessment  All further PT needs can be met in the next venue of care    Follow Up Recommendations  Home health PT               Equipment Recommendations    w/c and [facility]        Frequency      Precautions / Restrictions Precautions Precautions: Fall Restrictions Weight Bearing Restrictions: No         Mobility  Bed Mobility Overal bed mobility: Needs Assistance Bed Mobility: Supine to Sit Supine to sit: Supervision Transfers Overall transfer level: Needs assistance Equipment used: None Transfers: Sit to/from Stand Sit to Stand: Supervision General transfer comment: tends to lean backward upon standing and needs supervision due to this Ambulation/Gait Ambulation/Gait assistance: Min guard Ambulation Distance (Feet): 20 Feet Assistive device: Rolling walker (2 wheeled) Gait Pattern/deviations: WFL(Within Functional Limits) Gait velocity interpretation: at or above normal speed for age/gender General Gait Details: needs cues to lean anteriorly         PT Diagnosis: Difficulty walking;Abnormality of  gait  PT Problem List: Decreased activity tolerance;Decreased balance;Decreased mobility;Decreased knowledge of use of DME PT Treatment Interventions:       Acute Rehab PT Goals PT Goal Formulation: No goals set, d/c therapy  Visit Information  Last PT Received On: 05/11/13 History of Present Illness: Pt was admitted with hypercalcemia.  She is clinically found to have breast CA and is currently undergoing tests to confirm this.  She lives alone but has family rotating caregiving so she has 24 hour care.  Family states that pt became so weak last week that she was unable to walk. Prior to this she was able to walk in the home independently.       Prior Functioning  Home Living Family/patient expects to be discharged to:: Private residence Living Arrangements: Children;Other relatives Available Help at Discharge: Family;Available 24 hours/day Type of Home: House Home Access: Level entry Home Layout: One level Home Equipment: None Additional Comments: family has requested w/c and BSC Prior Function Level of Independence: Needs assistance Gait / Transfers Assistance Needed: transferred and walked short distances with no assist ADL's / Homemaking Assistance Needed: assist needed for sponge bath and dressing Communication Communication: No difficulties    Cognition  Cognition Arousal/Alertness: Awake/alert Behavior During Therapy: WFL for tasks assessed/performed Overall Cognitive Status: Within Functional Limits for tasks assessed    Extremity/Trunk Assessment Lower Extremity Assessment Lower Extremity Assessment: Overall WFL for tasks assessed   Balance Balance Overall balance assessment: Needs assistance Sitting-balance support: No upper extremity supported;Feet supported Sitting  balance-Leahy Scale: Good Standing balance support: No upper extremity supported Standing balance-Leahy Scale: Poor  End of Session PT - End of Session Equipment Utilized During Treatment: Gait  belt Activity Tolerance: Patient tolerated treatment well Patient left: in chair;with call bell/phone within reach;with family/visitor present  GP     Sable Feil 05/11/2013, 9:18 AM

## 2013-05-11 NOTE — Progress Notes (Signed)
Patient's right breast incision is healing well. Agree with discharge. Further management pending pathology results from right breast biopsy.

## 2013-05-12 LAB — GLUCOSE, CAPILLARY: GLUCOSE-CAPILLARY: 81 mg/dL (ref 70–99)

## 2013-05-13 ENCOUNTER — Encounter (HOSPITAL_COMMUNITY): Payer: Self-pay | Admitting: General Surgery

## 2013-05-18 ENCOUNTER — Encounter (HOSPITAL_COMMUNITY): Payer: Self-pay

## 2013-05-18 ENCOUNTER — Encounter (HOSPITAL_COMMUNITY): Payer: Medicare Other | Attending: Hematology and Oncology

## 2013-05-18 ENCOUNTER — Encounter (HOSPITAL_BASED_OUTPATIENT_CLINIC_OR_DEPARTMENT_OTHER): Payer: Medicare Other

## 2013-05-18 VITALS — BP 122/58 | HR 98 | Temp 97.9°F | Resp 18 | Wt 109.0 lb

## 2013-05-18 DIAGNOSIS — D638 Anemia in other chronic diseases classified elsewhere: Secondary | ICD-10-CM

## 2013-05-18 DIAGNOSIS — E119 Type 2 diabetes mellitus without complications: Secondary | ICD-10-CM | POA: Insufficient documentation

## 2013-05-18 DIAGNOSIS — Z09 Encounter for follow-up examination after completed treatment for conditions other than malignant neoplasm: Secondary | ICD-10-CM | POA: Insufficient documentation

## 2013-05-18 DIAGNOSIS — C50919 Malignant neoplasm of unspecified site of unspecified female breast: Secondary | ICD-10-CM | POA: Insufficient documentation

## 2013-05-18 DIAGNOSIS — Z794 Long term (current) use of insulin: Secondary | ICD-10-CM | POA: Insufficient documentation

## 2013-05-18 DIAGNOSIS — Z170 Estrogen receptor positive status [ER+]: Secondary | ICD-10-CM | POA: Insufficient documentation

## 2013-05-18 DIAGNOSIS — I10 Essential (primary) hypertension: Secondary | ICD-10-CM | POA: Insufficient documentation

## 2013-05-18 LAB — CBC WITH DIFFERENTIAL/PLATELET
Basophils Absolute: 0 10*3/uL (ref 0.0–0.1)
Basophils Relative: 1 % (ref 0–1)
Eosinophils Absolute: 0.2 10*3/uL (ref 0.0–0.7)
Eosinophils Relative: 3 % (ref 0–5)
HEMATOCRIT: 34.5 % — AB (ref 36.0–46.0)
Hemoglobin: 11.2 g/dL — ABNORMAL LOW (ref 12.0–15.0)
LYMPHS PCT: 29 % (ref 12–46)
Lymphs Abs: 1.5 10*3/uL (ref 0.7–4.0)
MCH: 27.3 pg (ref 26.0–34.0)
MCHC: 32.5 g/dL (ref 30.0–36.0)
MCV: 84.1 fL (ref 78.0–100.0)
MONO ABS: 0.5 10*3/uL (ref 0.1–1.0)
MONOS PCT: 10 % (ref 3–12)
NEUTROS ABS: 3 10*3/uL (ref 1.7–7.7)
Neutrophils Relative %: 58 % (ref 43–77)
Platelets: 266 10*3/uL (ref 150–400)
RBC: 4.1 MIL/uL (ref 3.87–5.11)
RDW: 14.8 % (ref 11.5–15.5)
WBC: 5.1 10*3/uL (ref 4.0–10.5)

## 2013-05-18 LAB — COMPREHENSIVE METABOLIC PANEL
ALT: 7 U/L (ref 0–35)
AST: 15 U/L (ref 0–37)
Albumin: 3.1 g/dL — ABNORMAL LOW (ref 3.5–5.2)
Alkaline Phosphatase: 190 U/L — ABNORMAL HIGH (ref 39–117)
BILIRUBIN TOTAL: 0.3 mg/dL (ref 0.3–1.2)
BUN: 12 mg/dL (ref 6–23)
CHLORIDE: 99 meq/L (ref 96–112)
CO2: 23 meq/L (ref 19–32)
CREATININE: 0.76 mg/dL (ref 0.50–1.10)
Calcium: 9.9 mg/dL (ref 8.4–10.5)
GFR calc Af Amer: 89 mL/min — ABNORMAL LOW (ref >90)
GFR calc non Af Amer: 77 mL/min — ABNORMAL LOW (ref >90)
Glucose, Bld: 124 mg/dL — ABNORMAL HIGH (ref 70–99)
Potassium: 3.6 mEq/L — ABNORMAL LOW (ref 3.7–5.3)
Sodium: 137 mEq/L (ref 137–147)
Total Protein: 8.7 g/dL — ABNORMAL HIGH (ref 6.0–8.3)

## 2013-05-18 MED ORDER — LETROZOLE 2.5 MG PO TABS: 2.5000 mg | ORAL_TABLET | Freq: Every day | ORAL | Status: DC

## 2013-05-18 MED ORDER — HYDROCODONE-ACETAMINOPHEN 5-325 MG PO TABS: ORAL_TABLET | ORAL | Status: DC

## 2013-05-18 NOTE — Progress Notes (Signed)
Kelli Lopez presented for labwork. Labs per MD order drawn via Peripheral Line 23 gauge needle inserted in left AC  Good blood return present. Procedure without incident.  Needle removed intact. Patient tolerated procedure well.

## 2013-05-18 NOTE — Patient Instructions (Signed)
[facility] Discharge Instructions  RECOMMENDATIONS MADE BY THE CONSULTANT AND ANY TEST RESULTS WILL BE SENT TO YOUR REFERRING PHYSICIAN.  EXAM FINDINGS BY THE PHYSICIAN TODAY AND SIGNS OR SYMPTOMS TO REPORT TO CLINIC OR PRIMARY PHYSICIAN: Exam and findings as discussed by Dr.Formanek.  Will check some blood work today and will also check a bone scan.  Will make a referral for you to be seen by Radiation Oncology also.  MEDICATIONS PRESCRIBED:  Femara take 1 daily  INSTRUCTIONS/FOLLOW-UP: Bone Scan on Monday at 10:30am - report to xray at 10:15 for injection and you will return at 1:30 for the scan. Follow-up in 2 weeks.  Thank you for choosing [facility] to provide your oncology and hematology care.  To afford each patient quality time with our providers, please arrive at least 15 minutes before your scheduled appointment time.  With your help, our goal is to use those 15 minutes to complete the necessary work-up to ensure our physicians have the information they need to help with your evaluation and healthcare recommendations.    Effective January 1st, 2014, we ask that you re-schedule your appointment with our physicians should you arrive 10 or more minutes late for your appointment.  We strive to give you quality time with our providers, and arriving late affects you and other patients whose appointments are after yours.    Again, thank you for choosing [facility].  Our hope is that these requests will decrease the amount of time that you wait before being seen by our physicians.       _____________________________________________________________  Should you have questions after your visit to [facility], please contact our office at [phone] between the hours of 8:30 a.m. and 5:00 p.m.  Voicemails left after 4:30 p.m. will not be returned until the following business day.  For prescription refill requests, have your  pharmacy contact our office with your prescription refill request.     Letrozole tablets What is this medicine? LETROZOLE (LET roe zole) blocks the production of estrogen. Certain types of breast cancer grow under the influence of estrogen. Letrozole helps block tumor growth. This medicine is used to treat advanced breast cancer in postmenopausal women. This medicine may be used for other purposes; ask your health care provider or pharmacist if you have questions. COMMON BRAND NAME(S): Femara What should I tell my health care provider before I take this medicine? They need to know if you have any of these conditions: -liver disease -osteoporosis (weak bones) -an unusual or allergic reaction to letrozole, other medicines, foods, dyes, or preservatives -pregnant or trying to get pregnant -breast-feeding How should I use this medicine? Take this medicine by mouth with a glass of water. You may take it with or without food. Follow the directions on the prescription label. Take your medicine at regular intervals. Do not take your medicine more often than directed. Do not stop taking except on your doctor's advice. Talk to your pediatrician regarding the use of this medicine in children. Special care may be needed. Overdosage: If you think you have taken too much of this medicine contact a poison control center or emergency room at once. NOTE: This medicine is only for you. Do not share this medicine with others. What if I miss a dose? If you miss a dose, take it as soon as you can. If it is almost time for your next dose, take only that dose. Do not take  double or extra doses. What may interact with this medicine? Do not take this medicine with any of the following medications: -estrogens, like hormone replacement therapy or birth control pills This medicine may also interact with the following medications: -dietary supplements such as androstenedione or DHEA -prasterone -tamoxifen This list  may not describe all possible interactions. Give your health care provider a list of all the medicines, herbs, non-prescription drugs, or dietary supplements you use. Also tell them if you smoke, drink alcohol, or use illegal drugs. Some items may interact with your medicine. What should I watch for while using this medicine? Visit your doctor or health care professional for regular check-ups to monitor your condition. Do not use this drug if you are pregnant. Serious side effects to an unborn child are possible. Talk to your doctor or pharmacist for more information. You may get drowsy or dizzy. Do not drive, use machinery, or do anything that needs mental alertness until you know how this medicine affects you. Do not stand or sit up quickly, especially if you are an older patient. This reduces the risk of dizzy or fainting spells. What side effects may I notice from receiving this medicine? Side effects that you should report to your doctor or health care professional as soon as possible: -allergic reactions like skin rash, itching, or hives -bone fracture -chest pain -difficulty breathing or shortness of breath -severe pain, swelling, warmth in the leg -unusually weak or tired -vaginal bleeding Side effects that usually do not require medical attention (report to your doctor or health care professional if they continue or are bothersome): -bone, back, joint, or muscle pain -dizziness -fatigue -fluid retention -headache -hot flashes, night sweats -nausea -weight gain This list may not describe all possible side effects. Call your doctor for medical advice about side effects. You may report side effects to FDA at 1-800-FDA-1088. Where should I keep my medicine? Keep out of the reach of children. Store between 15 and 30 degrees C (59 and 86 degrees F). Throw away any unused medicine after the expiration date. NOTE: This sheet is a summary. It may not cover all possible information. If you  have questions about this medicine, talk to your doctor, pharmacist, or health care provider.  2014, Elsevier/Gold Standard. (2007-04-24 16:43:44)

## 2013-05-18 NOTE — Progress Notes (Signed)
Kelli Lopez  OFFICE PROGRESS NOTE  No primary provider on file. No primary provider on file.  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%),  PR-, HER-2 negative, Ki67=36% - Plan: acetaminophen (TYLENOL) 500 MG tablet, HYDROcodone-acetaminophen (NORCO/VICODIN) 5-325 MG per tablet, NM Bone Scan Whole Body, letrozole (FEMARA) 2.5 MG tablet, Consult to radiation oncology, CBC with Differential, Comprehensive metabolic panel, CBC with Differential, Comprehensive metabolic panel  Anemia of chronic disease - Plan: acetaminophen (TYLENOL) 500 MG tablet, HYDROcodone-acetaminophen (NORCO/VICODIN) 5-325 MG per tablet, NM Bone Scan Whole Body, letrozole (FEMARA) 2.5 MG tablet, Consult to radiation oncology, CBC with Differential, Comprehensive metabolic panel, CBC with Differential, Comprehensive metabolic panel  Diabetes mellitus type II, non insulin dependent - Plan: acetaminophen (TYLENOL) 500 MG tablet, HYDROcodone-acetaminophen (NORCO/VICODIN) 5-325 MG per tablet, NM Bone Scan Whole Body, letrozole (FEMARA) 2.5 MG tablet, Consult to radiation oncology, CBC with Differential, Comprehensive metabolic panel, CBC with Differential, Comprehensive metabolic panel  Chief Complaint  Patient presents with  . Breast Cancer    CURRENT THERAPY: Status post breast biopsy 05/10/2013  INTERVAL HISTORY: Kelli Lopez 80 y.o. female returns for followup and discussion regarding management of newly diagnosed right breast cancer after being hospitalized for "weakness" and found to have hypercalcemia. Therapy with pamidronate and IV fluids normalized the calcium and before the  final pathology was obtained, the patient was discharged home for outpatient followup. She lives with her daughter in [place]. Appetite is only fair with no nausea or vomiting. She does feel weak and does have pain particularly involving the cervical spine. She denies any peripheral paresthesias,  lymphedema, diarrhea, constipation, melena, hematochezia, hematuria, epistaxis, hemoptysis, headache, or seizures. She also denies any hot flashes, vaginal discharge or bleeding, incontinence, lower extremity swelling or redness, skin rash,  MEDICAL HISTORY: Past Medical History  Diagnosis Date  . Renal disorder   . Diabetes mellitus without complication   . Breast cancer   . 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    ALLERGIES:  has No Known Allergies.  MEDICATIONS: has a current medication list which includes the following prescription(s): acetaminophen, alprazolam, amlodipine, hydrocodone-acetaminophen, losartan-hydrochlorothiazide, metformin, vitamin d (ergocalciferol), and letrozole.  SURGICAL HISTORY:  Past Surgical History  Procedure Laterality Date  . No past surgeries    . Breast biopsy Right 05/10/2013    Procedure: BREAST BIOPSY;  Surgeon: Jamesetta So, MD;  Location: [facility];  Service: General;  Laterality: Right;    FAMILY HISTORY: family history includes Stroke in her mother.  SOCIAL HISTORY:  reports that she has never smoked. She has never used smokeless tobacco. She reports that she does not drink alcohol or use illicit drugs.  REVIEW OF SYSTEMS:  Other than that discussed above is noncontributory.  PHYSICAL EXAMINATION: ECOG PERFORMANCE STATUS: 2 - Symptomatic, <50% confined to bed  Blood pressure 122/58, pulse 98, temperature 97.9 F (36.6 C), temperature source Oral, resp. rate 18, weight 109 lb (49.442 kg), SpO2 97.00%.  GENERAL:alert, no distress and comfortable SKIN: skin color, texture, turgor are normal, no rashes or significant lesions EYES: PERLA; Conjunctiva are pink and non-injected, sclera clear SINUSES: No redness or tenderness over maxillary or ethmoid  sinuses OROPHARYNX:no exudate, no erythema on lips, buccal mucosa, or tongue. NECK: supple, thyroid normal size, non-tender, without nodularity. No masses CHEST: Right breast replaced by  solid tumor with evidence of skin breakdown and necrosis without hemorrhage or purulence. Tumor is attached to the chest wall tightly. Kyphosis is noted. LYMPH:  no palpable lymphadenopathy in the cervical, axillary or inguinal LUNGS: clear to auscultation and percussion with normal breathing effort HEART: regular rate & rhythm and no murmurs. ABDOMEN:abdomen soft, non-tender and normal bowel sounds MUSCULOSKELETAL:no cyanosis of digits and no clubbing. Range of motion normal.  NEURO: alert & oriented x 3 with fluent speech, no focal motor/sensory deficits. Answers questions that are simple appropriately but has poor short-term memory.   LABORATORY DATA:  05/08/2013: CA 27-29 = 44; CEA equal to 1.2 05/06/2013: Calcium 11.6 05/11/2013: Calcium 8.2  WBC Latest Range: 4.0-10.5 K/uL 5.3  RBC Latest Range: 3.87-5.11 MIL/uL 3.67 (L)  Hemoglobin Latest Range: 12.0-15.0 g/dL 10.1 (L)  HCT Latest Range: 36.0-46.0 % 30.7 (L)  MCV Latest Range: 78.0-100.0 fL 83.7  MCH Latest Range: 26.0-34.0 pg 27.5  MCHC Latest Range: 30.0-36.0 g/dL 32.9  RDW Latest Range: 11.5-15.5 % 14.9  Platelets Latest Range: 150-400 K/uL 189  Neutrophils Relative % Latest Range: 43-77 % 55  Lymphocytes Relative Latest Range: 12-46 % 25  Monocytes Relative Latest Range: 3-12 % 14 (H)  Eosinophils Relative Latest Range: 0-5 % 5  Basophils Relative Latest Range: 0-1 % 0  NEUT# Latest Range: 1.7-7.7 K/uL 3.0  Lymphocytes Absolute Latest Range: 0.7-4.0 K/uL 1.3  Monocytes Absolute Latest Range: 0.1-1.0 K/uL 0.7  Eosinophils Absolute Latest Range: 0.0-0.7 K/uL 0.3  Basophils Absolute Latest Range: 0.0-0.1 K/uL 0.0     Office Visit on 05/18/2013  Component Date Value Ref Range Status  . WBC 05/18/2013 5.1  4.0 - 10.5 K/uL Final  . RBC  05/18/2013 4.10  3.87 - 5.11 MIL/uL Final  . Hemoglobin 05/18/2013 11.2* 12.0 - 15.0 g/dL Final  . HCT 05/18/2013 34.5* 36.0 - 46.0 % Final  . MCV 05/18/2013 84.1  78.0 - 100.0 fL Final  . MCH 05/18/2013 27.3  26.0 - 34.0 pg Final  . MCHC 05/18/2013 32.5  30.0 - 36.0 g/dL Final  . RDW 05/18/2013 14.8  11.5 - 15.5 % Final  . Platelets 05/18/2013 266  150 - 400 K/uL Final  . Neutrophils Relative % 05/18/2013 58  43 - 77 % Final  . Neutro Abs 05/18/2013 3.0  1.7 - 7.7 K/uL Final  . Lymphocytes Relative 05/18/2013 29  12 - 46 % Final  . Lymphs Abs 05/18/2013 1.5  0.7 - 4.0 K/uL Final  . Monocytes Relative 05/18/2013 10  3 - 12 % Final  . Monocytes Absolute 05/18/2013 0.5  0.1 - 1.0 K/uL Final  . Eosinophils Relative 05/18/2013 3  0 - 5 % Final  . Eosinophils Absolute 05/18/2013 0.2  0.0 - 0.7 K/uL Final  . Basophils Relative 05/18/2013 1  0 - 1 % Final  . Basophils Absolute 05/18/2013 0.0  0.0 - 0.1 K/uL Final  . Sodium 05/18/2013 137  137 - 147 mEq/L Final  . Potassium 05/18/2013 3.6* 3.7 - 5.3 mEq/L Final  . Chloride 05/18/2013 99  96 - 112 mEq/L Final  . CO2 05/18/2013 23  19 - 32 mEq/L Final  . Glucose, Bld 05/18/2013 124* 70 - 99 mg/dL Final  . BUN 05/18/2013 12  6 - 23 mg/dL Final  . Creatinine, Ser 05/18/2013 0.76  0.50 - 1.10 mg/dL Final  . Calcium 05/18/2013 9.9  8.4 - 10.5 mg/dL Final  . Total Protein 05/18/2013 8.7* 6.0 - 8.3 g/dL Final  . Albumin 05/18/2013  3.1* 3.5 - 5.2 g/dL Final  . AST 05/18/2013 15  0 - 37 U/L Final  . ALT 05/18/2013 7  0 - 35 U/L Final  . Alkaline Phosphatase 05/18/2013 190* 39 - 117 U/L Final  . Total Bilirubin 05/18/2013 0.3  0.3 - 1.2 mg/dL Final  . GFR calc non Af Amer 05/18/2013 77* >90 mL/min Final  . GFR calc Af Amer 05/18/2013 89* >90 mL/min Final   Comment: (NOTE)                          The eGFR has been calculated using the CKD EPI equation.                          This calculation has not been validated in all clinical situations.                           eGFR's persistently <90 mL/min signify possible Chronic Kidney                          Disease.  Admission on 05/06/2013, Discharged on 05/11/2013  No results displayed because visit has over 200 results.      PATHOLOGY:  Addendum FINAL for VIETTA, BONIFIELD ([id number]) Patient: CLANCY, LEINER Collected: 05/10/2013 Client: [facility] Accession: [id number] Received: 05/10/2013 Aviva Signs DOB: 1929/10/17 Age: 58 Gender: F Reported: 05/12/2013 [address] Patient Ph: [phone] MRN #: [id number] Linna [place] [address] Visit #: [id number] Chart #: Phone: [phone] Fax: CC: REPORT OF SURGICAL PATHOLOGY REASON FOR ADDENDUM, AMENDMENT OR CORRECTION: [id number]: E-cadherin results. 05/12/13 09:14:55 AM (gt) ADDITIONAL INFORMATION: iiPROGNOSTIC INDICATORS - ACIS Results: IMMUNOHISTOCHEMICAL AND MORPHOMETRIC ANALYSIS BY THE AUTOMATED CELLULAR IMAGING SYSTEM (ACIS) Estrogen Receptor: 7%, POSITIVE, WEAK STAINING INTENSITY Progesterone Receptor: 0%, NEGATIVE Proliferation Marker Ki67: 36% COMMENT: The negative hormone receptor study(ies) in this case has an internal positive control. REFERENCE RANGE ESTROGEN RECEPTOR NEGATIVE <1% POSITIVE =>1% PROGESTERONE RECEPTOR NEGATIVE <1% POSITIVE =>1% All controls stained appropriately Claudette Laws MD Pathologist, Electronic Signature ( Signed 05/14/2013) CHROMOGENIC IN-SITU HYBRIDIZATION Results: HER-2/NEU BY CISH - NO AMPLIFICATION OF HER-2 DETECTED. RESULT 1 of 3 Amended copy Addendum FINAL for SHAMIEKA, GULLO [id number]) ADDITIONAL INFORMATION:(continued) RATIO OF HER2: CEP 17 SIGNALS 1.26 AVERAGE HER2 COPY NUMBER PER CELL 2.80 REFERENCE RANGE NEGATIVE HER2/Chr17 Ratio <2.0 and Average HER2 copy number <4.0 EQUIVOCAL HER2/Chr17 Ratio <2.0 and Average HER2 copy number 4.0 and <6.0 POSITIVE HER2/Chr17 Ratio >=2.0 and/or Average HER2 copy number >=6.0 Claudette Laws MD Pathologist,  Electronic Signature ( Signed 05/14/2013) FINAL DIAGNOSIS Diagnosis Breast, biopsy, right - INVASIVE MAMMARY CARCINOMA. Microscopic Comment The findings favor grade II/III invasive ductal carcinoma. E-Cadherin and breast prognostic profile will be performed. The results were called to Dr. Arnoldo Morale on 05/11/2013. (JDP:kh 05/11/13) ADDENDUM: E-Cadherin immunohistochemistry is performed and the tumor is strongly positive consistent with invasive ductal carcinoma. (JDP:gt, 05/12/13) Claudette Laws MD Pathologist, Electronic Signature (Case signed 05/11/2013) Corrected Report Signer Claudette Laws MD Pathologist, Electronic Signature (Case signed 05/12/2013) Specimen Gross and Clinical Information Specimen(s) Obtained: Breast, biopsy, right Specimen Clinical Information right breast cancer Gross Received in formalin is a 2.5 x 1.7 x 1.2 cm aggregate of soft to rubbery fibrofatty tissue. The cut surface has 2 of 3 Amended copy Addendum FINAL for MADELIENE, TEJERA [phone]) Gross(continued) a solid but mottled yellow white fibrous appearance  without discrete nodules. The largest piece is sectioned and all tissue is submitted in three blocks. A, B = large piece sectioned C = two remaining fragments. (TB:kh 05-10-13) Stain(s) used in Diagnosis: The following stain(s) were used in diagnosing the case: PR-ACIS, CISH, KI-67-ACIS, ER-ACIS, E-CAD. The control(s) stained appropriately. Disclaimer Her2Neu by CISH (chromogenic in-situ hybridization) is performed at [facility], using the [place] pharmDx Kit (code number [phone]) Some of these immunohistochemical stains may have been developed and the performance characteristics determined by [facility]. Some may not have been cleared or approved by the U.S. Food and Drug Administration. The FDA has determined that such clearance or approval is not necessary. This test is used for clinical purposes. It should not be regarded  as investigational or for research. This laboratory is certified under the [employer] (CLIA-88) as qualified to perform high complexity clinical laboratory testing. PR progesterone receptor (PgR 636), immunohistochemical stains are performed on formalin fixed, paraffin embedded tissue using a 3,3"-diaminobenzidine (DAB) chromogen and [employer] Aut[employer]ainer System. The staining intensity of the nucleus is scored morphometrically using the Automated Cellular Imaging System (ACIS) and is reported as the percentage of tumor cell nuclei demonstrating specific nuclear staining. Ki-67 (Mib-1), immunohistochemical stains are performed on formalin fixed, paraffin embedded tissue using a 3,3"-diaminobenzidine (DAB) chromogen and [place]. The staining intensity of the nucleus is scored morphometrically using the Automated Cellular Imaging System (ACIS) and is reported as the percentage of tumor cell nuclei demonstrating specific nuclear staining. Estrogen receptor (SP1), immunohistochemical stains are performed on formalin fixed, paraffin embedded tissue using a 3,3"-diaminobenzidine (DAB) chromogen and [employer] Autostainer System. The staining intensity of the nucleus is scored morphometrically using the Automated Cellular Imaging System (ACIS) and is reported as the percentage of tumor cell nuclei demonstrating specific nuclear staining. Report signed out from the following location(s) Technical Component performed at [facility]. [address],[place] [address].OEVO:[id number]., Technical Component performed at [facility], [place], [place] [address]. CLIA #: [id number], Interpretation performed at [facility], [place], [place] [address]. CLIA #: [id number],  Urinalysis    Component Value Date/Time   COLORURINE YELLOW 05/06/2013 1944   APPEARANCEUR CLEAR  05/06/2013 1944   LABSPEC 1.025 05/06/2013 1944   PHURINE 5.5 05/06/2013 1944   GLUCOSEU NEGATIVE 05/06/2013 1944   HGBUR NEGATIVE 05/06/2013 1944   BILIRUBINUR NEGATIVE 05/06/2013 1944   KETONESUR NEGATIVE 05/06/2013 1944   PROTEINUR NEGATIVE 05/06/2013 1944   UROBILINOGEN 1.0 05/06/2013 1944   NITRITE NEGATIVE 05/06/2013 1944   LEUKOCYTESUR NEGATIVE 05/06/2013 1944    RADIOGRAPHIC STUDIES: Dg Chest 2 View  05/06/2013   CLINICAL DATA:  Chest pain  EXAM: CHEST  2 VIEW  COMPARISON:  None.  FINDINGS: Cardiac shadow is within normal limits. Diffuse density is noted particularly in the right lateral lung base. Would be difficult to exclude an acute infiltrate. Some soft tissue prominence is noted to the right of the trachea. The mass lesion would be difficult to exclude as there is some soft tissue density in this region on the lateral projection although it this likely represents a tortuous right innominate artery as there is some calcification adjacent to the density. Diffuse osteopenia is noted. Some old rib fractures with healing are noted bilaterally. No acute bony abnormality is noted.  IMPRESSION: Likely chronic changes although an acute right basilar infiltrate cannot be totally excluded.  Density to the right of the trachea likely  related to tortuous vascularity. Better position followup films may be helpful when the patient's condition improves.   Electronically Signed   By: Inez Catalina M.D.   On: 05/06/2013 19:36   Ct Head Wo Contrast  05/06/2013   CLINICAL DATA:  Weakness for 1 month  EXAM: CT HEAD WITHOUT CONTRAST  TECHNIQUE: Contiguous axial images were obtained from the base of the skull through the vertex without intravenous contrast.  COMPARISON:  None.  FINDINGS: Mild prominence of the sulci, cisterns, and ventricles, in keeping with mild cerebral and cerebellar volume loss. Mild to moderate subcortical and periventricular white matter hypodensities, a nonspecific finding often seen in the  setting of chronic microangiopathic change. No definite CT evidence of an acute infarction. No intraparenchymal hemorrhage, mass, mass effect, or abnormal extra-axial fluid collection. No hydrocephalus. Atherosclerotic vascular calcifications. The visualized paranasal sinuses and mastoid air cells are predominantly clear.  IMPRESSION: Volume loss and white matter changes as above. No definite CT evidence of acute intracranial abnormality.   Electronically Signed   By: Carlos Levering M.D.   On: 05/06/2013 21:11    ASSESSMENT:  #1. Locally advanced right breast cancer, possible bone metastases as evidenced by hypercalcemia at presentation. #2. Hypertension, controlled. #3. Hypercalcemia, status post therapy with pamidronate. #4. Diabetes mellitus, type II, non-insulin requiring.   PLAN:  #1. A discussion was held the patient, her daughter, and her daughter-in-law. Local control can be attained utilizing external beam radiotherapy given in [place] which is close to where she lives. #2. Whole body bone scan to stage. #3. Systemic therapy with letrozole 2.5 mg daily despite all the week estrogen receptor positivity. The family seems to be totally against any chemotherapy intervention.. Prescription was sent to the pharmacy and information about letrozole was given to the patient. #4. If bone metastases are documented, treatment with Xgeva monthly will also be instituted to lessen the probability of fractures and also to control calcium. #5. All are in agreement with this strategy. #6. Followup in 2 weeks.   All questions were answered. The patient knows to call the clinic with any problems, questions or concerns. We can certainly see the patient much sooner if necessary.   I spent 25 minutes counseling the patient face to face. The total time spent in the appointment was 30 minutes.    Doroteo Bradford, MD 05/19/2013 6:36 AM

## 2013-05-20 ENCOUNTER — Telehealth (HOSPITAL_COMMUNITY): Payer: Self-pay

## 2013-05-20 NOTE — Telephone Encounter (Signed)
Scheduled for Radiation consult on 05/25/13 per daughter.

## 2013-05-24 ENCOUNTER — Encounter (HOSPITAL_COMMUNITY)
Admission: RE | Admit: 2013-05-24 | Discharge: 2013-05-24 | Disposition: A | Discharge status: Home / Self Care | Payer: Medicare Other | Source: Ambulatory Visit | Attending: Hematology and Oncology | Admitting: Hematology and Oncology

## 2013-05-24 ENCOUNTER — Encounter (HOSPITAL_COMMUNITY): Payer: Self-pay

## 2013-05-24 DIAGNOSIS — D638 Anemia in other chronic diseases classified elsewhere: Secondary | ICD-10-CM | POA: Insufficient documentation

## 2013-05-24 DIAGNOSIS — C50919 Malignant neoplasm of unspecified site of unspecified female breast: Secondary | ICD-10-CM | POA: Insufficient documentation

## 2013-05-24 DIAGNOSIS — E119 Type 2 diabetes mellitus without complications: Secondary | ICD-10-CM

## 2013-05-24 MED ORDER — TECHNETIUM TC 99M MEDRONATE IV KIT
25.0000 | PACK | Freq: Once | INTRAVENOUS | Status: AC | PRN
  Administered 2013-05-24: 25 via INTRAVENOUS

## 2013-06-01 ENCOUNTER — Encounter (HOSPITAL_COMMUNITY): Payer: Medicare Other | Attending: Hematology and Oncology

## 2013-06-01 ENCOUNTER — Encounter (HOSPITAL_COMMUNITY): Payer: Self-pay

## 2013-06-01 VITALS — BP 142/74 | HR 96 | Temp 98.3°F | Resp 18 | Wt 107.6 lb

## 2013-06-01 DIAGNOSIS — C7952 Secondary malignant neoplasm of bone marrow: Secondary | ICD-10-CM

## 2013-06-01 DIAGNOSIS — C50919 Malignant neoplasm of unspecified site of unspecified female breast: Secondary | ICD-10-CM

## 2013-06-01 DIAGNOSIS — C7951 Secondary malignant neoplasm of bone: Secondary | ICD-10-CM

## 2013-06-01 MED ORDER — DENOSUMAB 120 MG/1.7ML ~~LOC~~ SOLN
120.0000 mg | Freq: Once | SUBCUTANEOUS | Status: AC
  Administered 2013-06-01: 120 mg via SUBCUTANEOUS
  Filled 2013-06-01: qty 1.7

## 2013-06-01 NOTE — Patient Instructions (Signed)
[facility] Discharge Instructions  RECOMMENDATIONS MADE BY THE CONSULTANT AND ANY TEST RESULTS WILL BE SENT TO YOUR REFERRING PHYSICIAN. We will see you in 4 weeks for a doctor's appointment and Xgeva injection. Please call for pain medication refill.   Thank you for choosing [facility] to provide your oncology and hematology care.  To afford each patient quality time with our providers, please arrive at least 15 minutes before your scheduled appointment time.  With your help, our goal is to use those 15 minutes to complete the necessary work-up to ensure our physicians have the information they need to help with your evaluation and healthcare recommendations.    Effective January 1st, 2014, we ask that you re-schedule your appointment with our physicians should you arrive 10 or more minutes late for your appointment.  We strive to give you quality time with our providers, and arriving late affects you and other patients whose appointments are after yours.    Again, thank you for choosing [facility].  Our hope is that these requests will decrease the amount of time that you wait before being seen by our physicians.       _____________________________________________________________  Should you have questions after your visit to [facility], please contact our office at [phone] between the hours of 8:30 a.m. and 5:00 p.m.  Voicemails left after 4:30 p.m. will not be returned until the following business day.  For prescription refill requests, have your pharmacy contact our office with your prescription refill request.

## 2013-06-01 NOTE — Progress Notes (Signed)
Kelli Lopez presents today for injection per MD orders. xgeva administered SQ in right Abdomen. Administration without incident. Patient tolerated well.

## 2013-06-01 NOTE — Progress Notes (Signed)
[place]  OFFICE PROGRESS [name], MD [address] [place] [address]  DIAGNOSIS: Breast cancer  Bone metastases  Chief Complaint  Patient presents with  . Breast cancer with bone metastases    CURRENT THERAPY: Letrozole 2.5 mg daily.  INTERVAL HISTORY: Kelli Lopez 80 y.o. female returns for followup after initiation of letrozole therapy for locally advanced left breast cancer.  She has experienced some hot flashes from letrozole. There is some erythematous change in the right breast since starting the drug. She is at low back pain that is slightly worse requiring a higher dose of hydrocodone up to 7.5 mg taking 3 or 4 per day. She's had regular bowel movements without dysuria or incontinence. She denies any nausea, vomiting, melena, hematochezia, hematuria, lower extremity swelling or redness, PND, orthopnea, palpitations, headache, or seizures.  MEDICAL HISTORY: Past Medical History  Diagnosis Date  . Renal disorder   . Diabetes mellitus without complication   . Breast cancer   . 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    ALLERGIES:  has No Known Allergies.  MEDICATIONS: has a current medication list which includes the following prescription(s): acetaminophen, alprazolam, amlodipine, hydrocodone-acetaminophen, letrozole, losartan-hydrochlorothiazide, metformin, vitamin d (ergocalciferol), and hydrocodone-acetaminophen.  SURGICAL HISTORY:  Past Surgical History  Procedure Laterality Date  . No past surgeries    . Breast biopsy Right 05/10/2013    Procedure: BREAST BIOPSY;  Surgeon: Jamesetta So, MD;  Location: [facility];  Service: General;  Laterality: Right;    FAMILY HISTORY: family history includes Stroke in her  mother.  SOCIAL HISTORY:  reports that she has never smoked. She has never used smokeless tobacco. She reports that she does not drink alcohol or use illicit drugs.  REVIEW OF SYSTEMS:  Other than that discussed above is noncontributory.  PHYSICAL EXAMINATION: ECOG PERFORMANCE STATUS: 1 - Symptomatic but completely ambulatory  Blood pressure 142/74, pulse 96, temperature 98.3 F (36.8 C), temperature source Oral, resp. rate 18, weight 107 lb 9.6 oz (48.807 kg), SpO2 97.00%.  GENERAL:alert, no distress and comfortable, thin and in good spirits. SKIN: skin color, texture, turgor are normal, no rashes or significant lesions EYES: PERLA; Conjunctiva are pink and non-injected, sclera clear SINUSES: No redness or tenderness over maxillary or ethmoid sinuses OROPHARYNX:no exudate, no erythema on lips, buccal mucosa, or tongue. NECK: supple, thyroid normal size, non-tender, without nodularity. No masses CHEST: Left breast normal. Right breast replaced by a large mass affixed to the chest wall with area of skin breakdown without bleeding or purulence. Right axillary fullness is noted with lymphadenopathy that also was fixed. LYMPH:  no palpable lymphadenopathy in the cervical, axillary or inguinal LUNGS: clear to auscultation and percussion with normal breathing effort HEART: regular rate & rhythm and no murmurs. ABDOMEN:abdomen soft, non-tender and normal bowel sounds MUSCULOSKELETAL:no cyanosis of digits and no clubbing. Range of motion normal.  NEURO: alert & oriented x 3 with fluent speech, no focal motor/sensory deficits   LABORATORY DATA: Office Visit on 05/18/2013  Component Date Value Ref Range Status  . WBC 05/18/2013 5.1  4.0 - 10.5 K/uL Final  . RBC 05/18/2013 4.10  3.87 - 5.11 MIL/uL Final  . Hemoglobin 05/18/2013 11.2* 12.0 - 15.0 g/dL Final  . HCT 05/18/2013 34.5* 36.0 - 46.0 %  Final  . MCV 05/18/2013 84.1  78.0 - 100.0 fL Final  . MCH 05/18/2013 27.3  26.0 - 34.0 pg Final  .  MCHC 05/18/2013 32.5  30.0 - 36.0 g/dL Final  . RDW 05/18/2013 14.8  11.5 - 15.5 % Final  . Platelets 05/18/2013 266  150 - 400 K/uL Final  . Neutrophils Relative % 05/18/2013 58  43 - 77 % Final  . Neutro Abs 05/18/2013 3.0  1.7 - 7.7 K/uL Final  . Lymphocytes Relative 05/18/2013 29  12 - 46 % Final  . Lymphs Abs 05/18/2013 1.5  0.7 - 4.0 K/uL Final  . Monocytes Relative 05/18/2013 10  3 - 12 % Final  . Monocytes Absolute 05/18/2013 0.5  0.1 - 1.0 K/uL Final  . Eosinophils Relative 05/18/2013 3  0 - 5 % Final  . Eosinophils Absolute 05/18/2013 0.2  0.0 - 0.7 K/uL Final  . Basophils Relative 05/18/2013 1  0 - 1 % Final  . Basophils Absolute 05/18/2013 0.0  0.0 - 0.1 K/uL Final  . Sodium 05/18/2013 137  137 - 147 mEq/L Final  . Potassium 05/18/2013 3.6* 3.7 - 5.3 mEq/L Final  . Chloride 05/18/2013 99  96 - 112 mEq/L Final  . CO2 05/18/2013 23  19 - 32 mEq/L Final  . Glucose, Bld 05/18/2013 124* 70 - 99 mg/dL Final  . BUN 05/18/2013 12  6 - 23 mg/dL Final  . Creatinine, Ser 05/18/2013 0.76  0.50 - 1.10 mg/dL Final  . Calcium 05/18/2013 9.9  8.4 - 10.5 mg/dL Final  . Total Protein 05/18/2013 8.7* 6.0 - 8.3 g/dL Final  . Albumin 05/18/2013 3.1* 3.5 - 5.2 g/dL Final  . AST 05/18/2013 15  0 - 37 U/L Final  . ALT 05/18/2013 7  0 - 35 U/L Final  . Alkaline Phosphatase 05/18/2013 190* 39 - 117 U/L Final  . Total Bilirubin 05/18/2013 0.3  0.3 - 1.2 mg/dL Final  . GFR calc non Af Amer 05/18/2013 77* >90 mL/min Final  . GFR calc Af Amer 05/18/2013 89* >90 mL/min Final   Comment: (NOTE)                          The eGFR has been calculated using the CKD EPI equation.                          This calculation has not been validated in all clinical situations.                          eGFR's persistently <90 mL/min signify possible Chronic Kidney                          Disease.  Admission on 05/06/2013, Discharged on 05/11/2013  No results displayed because visit has over 200 results.       PATHOLOGY: Minimally positive ER on breast biopsy  Urinalysis    Component Value Date/Time   COLORURINE YELLOW 05/06/2013 1944   APPEARANCEUR CLEAR 05/06/2013 1944   LABSPEC 1.025 05/06/2013 1944   PHURINE 5.5 05/06/2013 1944   GLUCOSEU NEGATIVE 05/06/2013 1944   HGBUR NEGATIVE 05/06/2013 1944   BILIRUBINUR NEGATIVE 05/06/2013 1944   KETONESUR NEGATIVE 05/06/2013 1944   PROTEINUR NEGATIVE 05/06/2013 1944   UROBILINOGEN 1.0 05/06/2013 1944   NITRITE NEGATIVE 05/06/2013 1944   LEUKOCYTESUR NEGATIVE 05/06/2013 1944  RADIOGRAPHIC STUDIES: Dg Chest 2 View  05/06/2013   CLINICAL DATA:  Chest pain  EXAM: CHEST  2 VIEW  COMPARISON:  None.  FINDINGS: Cardiac shadow is within normal limits. Diffuse density is noted particularly in the right lateral lung base. Would be difficult to exclude an acute infiltrate. Some soft tissue prominence is noted to the right of the trachea. The mass lesion would be difficult to exclude as there is some soft tissue density in this region on the lateral projection although it this likely represents a tortuous right innominate artery as there is some calcification adjacent to the density. Diffuse osteopenia is noted. Some old rib fractures with healing are noted bilaterally. No acute bony abnormality is noted.  IMPRESSION: Likely chronic changes although an acute right basilar infiltrate cannot be totally excluded.  Density to the right of the trachea likely related to tortuous vascularity. Better position followup films may be helpful when the patient's condition improves.   Electronically Signed   By: Inez Catalina M.D.   On: 05/06/2013 19:36   Ct Head Wo Contrast  05/06/2013   CLINICAL DATA:  Weakness for 1 month  EXAM: CT HEAD WITHOUT CONTRAST  TECHNIQUE: Contiguous axial images were obtained from the base of the skull through the vertex without intravenous contrast.  COMPARISON:  None.  FINDINGS: Mild prominence of the sulci, cisterns, and ventricles, in keeping with mild  cerebral and cerebellar volume loss. Mild to moderate subcortical and periventricular white matter hypodensities, a nonspecific finding often seen in the setting of chronic microangiopathic change. No definite CT evidence of an acute infarction. No intraparenchymal hemorrhage, mass, mass effect, or abnormal extra-axial fluid collection. No hydrocephalus. Atherosclerotic vascular calcifications. The visualized paranasal sinuses and mastoid air cells are predominantly clear.  IMPRESSION: Volume loss and white matter changes as above. No definite CT evidence of acute intracranial abnormality.   Electronically Signed   By: Carlos Levering M.D.   On: 05/06/2013 21:11   Nm Bone Scan Whole Body  05/24/2013   CLINICAL DATA:  Right breast carcinoma.  Staging.  EXAM: NUCLEAR MEDICINE WHOLE BODY BONE SCAN  TECHNIQUE: Whole body anterior and posterior images were obtained approximately 3 hours after intravenous injection of radiopharmaceutical.  RADIOPHARMACEUTICALS:  25 mCi of Technetium-99 MDP  COMPARISON:  None  FINDINGS: There are multiple foci of abnormal radiotracer localization noted along the spine, along multiple ribs, in the sternum and pelvis. This is consistent with metastatic disease. Uptake in the shoulders may be arthropathic, metastatic or a combination, the latter favored. There is a small focus of uptake along the superior skull. Small focus of uptake noted along the greater trochanter of the left hip.  Renal uptake is symmetric.  IMPRESSION: 1. Metastatic disease to bone is noted with multiple areas of abnormal uptake along the axial skeleton.   Electronically Signed   By: Lajean Manes M.D.   On: 05/24/2013 14:28    ASSESSMENT:  #1. Stage IV right breast cancer with locally extensive disease and bone metastases, minimally ER positive. #2. Hypercalcemia, controlled. #3. Bone pain controlled on analgesics. #4. Hypertension, controlled. #5. Diabetes mellitus, type II, non-insulin requiring,  controlled.   PLAN:  #1. Continue letrozole 2.5 mg daily. #2. Patient does not wish to undergo radiotherapy at this time. #3Delton See 120 mg subcutaneous he today and monthly. #4. Office visit with CBC, chem profile in 4 weeks plus Xgeva   All questions were answered. The patient knows to call the clinic with any problems, questions  or concerns. We can certainly see the patient much sooner if necessary.   I spent 25 minutes counseling the patient face to face. The total time spent in the appointment was 30 minutes.    Doroteo Bradford, MD 06/01/2013 3:00 PM

## 2013-06-16 ENCOUNTER — Telehealth (HOSPITAL_COMMUNITY): Payer: Self-pay | Admitting: Oncology

## 2013-06-16 NOTE — Telephone Encounter (Signed)
She can take 9 hydrocodone tablets in a 24 hour period.  Therefore, she can take 1-2 tablets every 4 hours for pain, not to exceed more than 9 per day.  If this is ineffective, she is to call us and I will change her to a more potent pain medication.

## 2013-06-16 NOTE — Telephone Encounter (Signed)
Patient was advised to take no more than 9 tablets of vicodin in a 24 hour period.  She can take 1-2 tablets every 4 hours as needed for pain.  If this does not help her pain, she is to call the clinic in the next 2 days and let the staff know if she is having continued pain.  This is per Kirby Crigler PA.

## 2013-06-29 ENCOUNTER — Encounter (HOSPITAL_COMMUNITY): Payer: Medicare Other | Attending: Hematology and Oncology

## 2013-06-29 DIAGNOSIS — C7951 Secondary malignant neoplasm of bone: Secondary | ICD-10-CM

## 2013-06-29 DIAGNOSIS — C7952 Secondary malignant neoplasm of bone marrow: Secondary | ICD-10-CM

## 2013-06-29 DIAGNOSIS — C50919 Malignant neoplasm of unspecified site of unspecified female breast: Secondary | ICD-10-CM | POA: Insufficient documentation

## 2013-06-29 LAB — CBC WITH DIFFERENTIAL/PLATELET
BASOS ABS: 0 10*3/uL (ref 0.0–0.1)
Basophils Relative: 1 % (ref 0–1)
EOS ABS: 0.2 10*3/uL (ref 0.0–0.7)
Eosinophils Relative: 5 % (ref 0–5)
HCT: 34.2 % — ABNORMAL LOW (ref 36.0–46.0)
Hemoglobin: 11.2 g/dL — ABNORMAL LOW (ref 12.0–15.0)
LYMPHS ABS: 1.6 10*3/uL (ref 0.7–4.0)
Lymphocytes Relative: 33 % (ref 12–46)
MCH: 27.1 pg (ref 26.0–34.0)
MCHC: 32.7 g/dL (ref 30.0–36.0)
MCV: 82.8 fL (ref 78.0–100.0)
Monocytes Absolute: 0.6 10*3/uL (ref 0.1–1.0)
Monocytes Relative: 11 % (ref 3–12)
Neutro Abs: 2.5 10*3/uL (ref 1.7–7.7)
Neutrophils Relative %: 50 % (ref 43–77)
PLATELETS: 239 10*3/uL (ref 150–400)
RBC: 4.13 MIL/uL (ref 3.87–5.11)
RDW: 15.3 % (ref 11.5–15.5)
WBC: 5 10*3/uL (ref 4.0–10.5)

## 2013-06-29 LAB — BASIC METABOLIC PANEL
BUN: 16 mg/dL (ref 6–23)
CO2: 23 mEq/L (ref 19–32)
Calcium: 9.3 mg/dL (ref 8.4–10.5)
Chloride: 100 mEq/L (ref 96–112)
Creatinine, Ser: 0.88 mg/dL (ref 0.50–1.10)
GFR calc Af Amer: 70 mL/min — ABNORMAL LOW (ref >90)
GFR, EST NON AFRICAN AMERICAN: 60 mL/min — AB (ref >90)
Glucose, Bld: 130 mg/dL — ABNORMAL HIGH (ref 70–99)
POTASSIUM: 3.5 meq/L — AB (ref 3.7–5.3)
SODIUM: 137 meq/L (ref 137–147)

## 2013-06-30 ENCOUNTER — Encounter (HOSPITAL_COMMUNITY): Payer: Medicare Other

## 2013-06-30 ENCOUNTER — Encounter (HOSPITAL_BASED_OUTPATIENT_CLINIC_OR_DEPARTMENT_OTHER): Payer: Medicare Other

## 2013-06-30 ENCOUNTER — Encounter (HOSPITAL_COMMUNITY): Payer: Self-pay

## 2013-06-30 VITALS — BP 133/71 | HR 116 | Temp 98.3°F | Resp 18 | Wt 105.1 lb

## 2013-06-30 DIAGNOSIS — E119 Type 2 diabetes mellitus without complications: Secondary | ICD-10-CM

## 2013-06-30 DIAGNOSIS — L8990 Pressure ulcer of unspecified site, unspecified stage: Secondary | ICD-10-CM

## 2013-06-30 DIAGNOSIS — C7952 Secondary malignant neoplasm of bone marrow: Secondary | ICD-10-CM

## 2013-06-30 DIAGNOSIS — C7951 Secondary malignant neoplasm of bone: Secondary | ICD-10-CM

## 2013-06-30 DIAGNOSIS — C50919 Malignant neoplasm of unspecified site of unspecified female breast: Secondary | ICD-10-CM

## 2013-06-30 DIAGNOSIS — L89609 Pressure ulcer of unspecified heel, unspecified stage: Secondary | ICD-10-CM

## 2013-06-30 DIAGNOSIS — I10 Essential (primary) hypertension: Secondary | ICD-10-CM

## 2013-06-30 MED ORDER — DENOSUMAB 120 MG/1.7ML ~~LOC~~ SOLN
120.0000 mg | Freq: Once | SUBCUTANEOUS | Status: AC
  Administered 2013-06-30: 120 mg via SUBCUTANEOUS
  Filled 2013-06-30: qty 1.7

## 2013-06-30 MED ORDER — HYDROCODONE-ACETAMINOPHEN 7.5-325 MG PO TABS: 1.0000 | ORAL_TABLET | ORAL | Status: DC | PRN

## 2013-06-30 NOTE — Progress Notes (Signed)
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 Breast cancer with bone metastases    CURRENT THERAPY: Letrozole 2.5 mg daily plus Xgeva 120 mg subcutaneously monthly.  INTERVAL HISTORY: Kelli Lopez 80 y.o. female returns for followup and continuation of therapy for stage IV breast cancer with locally advanced disease and bone metastases while taking letrozole 2.5 mg daily and receiving Xgeva 120 mg subcutaneously monthly. At inpatient presentation she was also hypercalcemic treated successfully with pamidronate at that time. Appetite is good with no nausea or vomiting. She denies any severe pain but does require about 2 hydrocodone 7.5/325 per day. Bowel movement are regular. She denies any incontinence. She denies any bleeding or purulence in the right breast. She has developed a lesion on the left heel however. She denies any cough, wheezing, headache, or seizures.  MEDICAL HISTORY: Past Medical History  Diagnosis Date  . Renal disorder   . Diabetes mellitus without complication   . Breast cancer   . 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    ALLERGIES:  has No Known Allergies.  MEDICATIONS: has a current medication list which includes the following prescription(s): acetaminophen, alprazolam, amlodipine, hydrocodone-acetaminophen, letrozole, losartan-hydrochlorothiazide, metformin, and  vitamin d (ergocalciferol).  SURGICAL HISTORY:  Past Surgical History  Procedure Laterality Date  . No past surgeries    . Breast biopsy Right 05/10/2013    Procedure: BREAST BIOPSY;  Surgeon: Jamesetta So, MD;  Location: [facility];  Service: General;  Laterality: Right;    FAMILY HISTORY: family history includes Stroke in her mother.  SOCIAL HISTORY:  reports that she has never smoked. She has never used smokeless tobacco. She reports that she does not drink alcohol or use illicit drugs.  REVIEW OF SYSTEMS:  Other than that discussed above is noncontributory.  PHYSICAL EXAMINATION: ECOG PERFORMANCE STATUS: 2 - Symptomatic, <50% confined to bed  Blood pressure 133/71, pulse 116, temperature 98.3 F (36.8 C), temperature source Oral, resp. rate 18, weight 105 lb 1.6 oz (47.673 kg), SpO2 98.00%.  GENERAL:alert, no distress and comfortable SKIN: skin color, texture, turgor are normal, no rashes or significant lesions EYES: PERLA; Conjunctiva are pink and non-injected, sclera clear SINUSES: No redness or tenderness over maxillary or ethmoid sinuses OROPHARYNX:no exudate, no erythema on lips, buccal mucosa, or tongue. NECK: supple, thyroid normal size, non-tender, without nodularity. No masses CHEST: Left breast without mass. Right breast is replaced by a solid tumor involving mainly the medial half with an area of skin breakdown without purulence or bleeding. Lesion is fixed to the chest wall. Axillary fullness is noted. No lymphedema on the right upper extremity. LYMPH:  no palpable lymphadenopathy in the cervical, axillary or inguinal LUNGS: clear to auscultation and percussion with normal breathing effort HEART: regular rate & rhythm and no murmurs. ABDOMEN:abdomen soft, non-tender and normal bowel sounds MUSCULOSKELETAL:no cyanosis of digits and no clubbing. Range of motion normal. Left heel with skin breakdown with no purulence or bleeding. NEURO:  alert & oriented x 3 with fluent  speech, no focal motor/sensory deficits. Alert is sometimes forgetful of recent events.   LABORATORY DATA: Appointment on 06/29/2013  Component Date Value Ref Range Status  . WBC 06/29/2013 5.0  4.0 - 10.5 K/uL Final  . RBC 06/29/2013 4.13  3.87 - 5.11 MIL/uL Final  . Hemoglobin 06/29/2013 11.2* 12.0 - 15.0 g/dL Final  . HCT 06/29/2013 34.2* 36.0 - 46.0 % Final  . MCV 06/29/2013 82.8  78.0 - 100.0 fL Final  . MCH 06/29/2013 27.1  26.0 - 34.0 pg Final  . MCHC 06/29/2013 32.7  30.0 - 36.0 g/dL Final  . RDW 06/29/2013 15.3  11.5 - 15.5 % Final  . Platelets 06/29/2013 239  150 - 400 K/uL Final  . Neutrophils Relative % 06/29/2013 50  43 - 77 % Final  . Neutro Abs 06/29/2013 2.5  1.7 - 7.7 K/uL Final  . Lymphocytes Relative 06/29/2013 33  12 - 46 % Final  . Lymphs Abs 06/29/2013 1.6  0.7 - 4.0 K/uL Final  . Monocytes Relative 06/29/2013 11  3 - 12 % Final  . Monocytes Absolute 06/29/2013 0.6  0.1 - 1.0 K/uL Final  . Eosinophils Relative 06/29/2013 5  0 - 5 % Final  . Eosinophils Absolute 06/29/2013 0.2  0.0 - 0.7 K/uL Final  . Basophils Relative 06/29/2013 1  0 - 1 % Final  . Basophils Absolute 06/29/2013 0.0  0.0 - 0.1 K/uL Final  . Sodium 06/29/2013 137  137 - 147 mEq/L Final  . Potassium 06/29/2013 3.5* 3.7 - 5.3 mEq/L Final  . Chloride 06/29/2013 100  96 - 112 mEq/L Final  . CO2 06/29/2013 23  19 - 32 mEq/L Final  . Glucose, Bld 06/29/2013 130* 70 - 99 mg/dL Final  . BUN 06/29/2013 16  6 - 23 mg/dL Final  . Creatinine, Ser 06/29/2013 0.88  0.50 - 1.10 mg/dL Final  . Calcium 06/29/2013 9.3  8.4 - 10.5 mg/dL Final  . GFR calc non Af Amer 06/29/2013 60* >90 mL/min Final  . GFR calc Af Amer 06/29/2013 70* >90 mL/min Final   Comment: (NOTE)                          The eGFR has been calculated using the CKD EPI equation.                          This calculation has not been validated in all clinical situations.                          eGFR's persistently <90 mL/min signify possible  Chronic Kidney                          Disease.    PATHOLOGY: No new pathology.  Urinalysis    Component Value Date/Time   COLORURINE YELLOW 05/06/2013 1944   APPEARANCEUR CLEAR 05/06/2013 1944   LABSPEC 1.025 05/06/2013 1944   PHURINE 5.5 05/06/2013 1944   GLUCOSEU NEGATIVE 05/06/2013 1944   HGBUR NEGATIVE 05/06/2013 1944   BILIRUBINUR NEGATIVE 05/06/2013 1944   KETONESUR NEGATIVE 05/06/2013 1944   PROTEINUR NEGATIVE 05/06/2013 1944   UROBILINOGEN 1.0 05/06/2013 1944   NITRITE NEGATIVE 05/06/2013 1944   LEUKOCYTESUR NEGATIVE 05/06/2013 1944    RADIOGRAPHIC STUDIES: NM Bone Scan Whole Body Status: Final result  PACS Images    Show images for NM Bone Scan Whole Body         Study Result    CLINICAL DATA: Right breast carcinoma. Staging.  EXAM:  NUCLEAR MEDICINE WHOLE BODY BONE SCAN  TECHNIQUE:  Whole body anterior and posterior images were obtained approximately  3 hours after intravenous injection of radiopharmaceutical.  RADIOPHARMACEUTICALS: 25 mCi of Technetium-99 MDP  COMPARISON: None  FINDINGS:  There are multiple foci of abnormal radiotracer localization noted  along the spine, along multiple ribs, in the sternum and pelvis.  This is consistent with metastatic disease. Uptake in the shoulders  may be arthropathic, metastatic or a combination, the latter  favored. There is a small focus of uptake along the superior skull.  Small focus of uptake noted along the greater trochanter of the left  hip.  Renal uptake is symmetric.  IMPRESSION:  1. Metastatic disease to bone is noted with multiple areas of  abnormal uptake along the axial skeleton.  Electronically Signed  By: Lajean Manes M.D.  On: 05/24/2013 14:28      ASSESSMENT:  #1. Stage IV right breast cancer with locally extensive disease and bone metastases, minimally ER positive.  #2. Hypercalcemia, controlled.  #3. Bone pain controlled on analgesics.  #4. Hypertension, controlled.  #5.  Diabetes mellitus, type II, non-insulin requiring, controlled #6. Left heel grade 1 decubitus.   PLAN:  #1. Xgeva 120 mg subcutaneously today. #2. Continue Femara 2.5 mg daily. #3. Refill prescription for hydrocodone 7.5/APAP 325 #4. Followup in 4 weeks with CBC, chem profile, [place] 2729, CEA, and Xgeva.   All questions were answered. The patient knows to call the clinic with any problems, questions or concerns. We can certainly see the patient much sooner if necessary.   I spent 25 minutes counseling the patient face to face. The total time spent in the appointment was 30 minutes.    Farrel Gobble, MD 06/30/2013 11:19 AM  DISCLAIMER:  This note was dictated with voice recognition software.  Similar sounding words can inadvertently be transcribed inaccurately and may not be corrected upon review.

## 2013-06-30 NOTE — Patient Instructions (Signed)
[facility] Discharge Instructions  RECOMMENDATIONS MADE BY THE CONSULTANT AND ANY TEST RESULTS WILL BE SENT TO YOUR REFERRING PHYSICIAN.  EXAM FINDINGS BY THE PHYSICIAN TODAY AND SIGNS OR SYMPTOMS TO REPORT TO CLINIC OR PRIMARY PHYSICIAN: Exam and findings as discussed by Dr. Barnet Glasgow.  Need to try to keep pressure off left heel.  No changes in therapy.  Will give you your xgeva to help protect your bones. Report any new lumps, bone pain, shortness of breath or other symptoms.  MEDICATIONS PRESCRIBED:  Continue Femara Hydrocodone - take as directed.  INSTRUCTIONS/FOLLOW-UP: Follow-up with labs in 1 month and office visit with injection in 1 month.  Thank you for choosing [facility] to provide your oncology and hematology care.  To afford each patient quality time with our providers, please arrive at least 15 minutes before your scheduled appointment time.  With your help, our goal is to use those 15 minutes to complete the necessary work-up to ensure our physicians have the information they need to help with your evaluation and healthcare recommendations.    Effective January 1st, 2014, we ask that you re-schedule your appointment with our physicians should you arrive 10 or more minutes late for your appointment.  We strive to give you quality time with our providers, and arriving late affects you and other patients whose appointments are after yours.    Again, thank you for choosing [facility].  Our hope is that these requests will decrease the amount of time that you wait before being seen by our physicians.       _____________________________________________________________  Should you have questions after your visit to [facility], please contact our office at [phone] between the hours of 8:30 a.m. and 5:00 p.m.  Voicemails left after 4:30 p.m. will not be returned until the following business day.  For prescription  refill requests, have your pharmacy contact our office with your prescription refill request.

## 2013-07-14 ENCOUNTER — Telehealth (HOSPITAL_COMMUNITY): Payer: Self-pay | Admitting: Dietician

## 2013-07-14 NOTE — Telephone Encounter (Signed)
Nutrition Brief Note  Patient identified on the Malnutrition Screening Tool (MST) Report  Wt Readings from Last 15 Encounters:  06/30/13 105 lb 1.6 oz (47.673 kg)  06/01/13 107 lb 9.6 oz (48.807 kg)  05/18/13 109 lb (49.442 kg)  05/06/13 137 lb (62.143 kg)  05/06/13 137 lb (62.143 kg)   Pt followed at [facility] for stage IV breast cancer with bone mets, currently on chemo. Noted hx of progressive wt loss over the past 2 months. Last office note reveals appetite is good, however, suspect hx of poor appetite given significant weight loss.  Called pt home at [phone], however, pt was unavailable at time of call. Will continue to follow. Please do not hesitate to consult RD for additional nutrition concerns  Kaitlan Bin A. Jimmye Norman, RD, LDN Pager: [phone]

## 2013-07-20 ENCOUNTER — Other Ambulatory Visit (HOSPITAL_COMMUNITY): Payer: Self-pay | Admitting: Hematology and Oncology

## 2013-07-20 MED ORDER — HYDROCODONE-ACETAMINOPHEN 7.5-325 MG PO TABS: 1.0000 | ORAL_TABLET | ORAL | Status: DC | PRN

## 2013-07-23 ENCOUNTER — Encounter (HOSPITAL_BASED_OUTPATIENT_CLINIC_OR_DEPARTMENT_OTHER): Payer: Medicare Other

## 2013-07-23 DIAGNOSIS — C7952 Secondary malignant neoplasm of bone marrow: Secondary | ICD-10-CM

## 2013-07-23 DIAGNOSIS — C7951 Secondary malignant neoplasm of bone: Secondary | ICD-10-CM

## 2013-07-23 DIAGNOSIS — C50919 Malignant neoplasm of unspecified site of unspecified female breast: Secondary | ICD-10-CM

## 2013-07-23 LAB — COMPREHENSIVE METABOLIC PANEL
ALT: 5 U/L (ref 0–35)
AST: 14 U/L (ref 0–37)
Albumin: 3.1 g/dL — ABNORMAL LOW (ref 3.5–5.2)
Alkaline Phosphatase: 118 U/L — ABNORMAL HIGH (ref 39–117)
BUN: 13 mg/dL (ref 6–23)
CALCIUM: 9.2 mg/dL (ref 8.4–10.5)
CO2: 23 mEq/L (ref 19–32)
CREATININE: 0.78 mg/dL (ref 0.50–1.10)
Chloride: 103 mEq/L (ref 96–112)
GFR calc Af Amer: 88 mL/min — ABNORMAL LOW (ref >90)
GFR calc non Af Amer: 76 mL/min — ABNORMAL LOW (ref >90)
Glucose, Bld: 139 mg/dL — ABNORMAL HIGH (ref 70–99)
Potassium: 4.2 mEq/L (ref 3.7–5.3)
Sodium: 139 mEq/L (ref 137–147)
Total Bilirubin: 0.3 mg/dL (ref 0.3–1.2)
Total Protein: 8.1 g/dL (ref 6.0–8.3)

## 2013-07-23 LAB — CBC WITH DIFFERENTIAL/PLATELET
BASOS PCT: 0 % (ref 0–1)
Basophils Absolute: 0 10*3/uL (ref 0.0–0.1)
Eosinophils Absolute: 0.2 10*3/uL (ref 0.0–0.7)
Eosinophils Relative: 4 % (ref 0–5)
HEMATOCRIT: 32.7 % — AB (ref 36.0–46.0)
Hemoglobin: 10.2 g/dL — ABNORMAL LOW (ref 12.0–15.0)
Lymphocytes Relative: 32 % (ref 12–46)
Lymphs Abs: 1.5 10*3/uL (ref 0.7–4.0)
MCH: 25.7 pg — ABNORMAL LOW (ref 26.0–34.0)
MCHC: 31.2 g/dL (ref 30.0–36.0)
MCV: 82.4 fL (ref 78.0–100.0)
MONO ABS: 0.4 10*3/uL (ref 0.1–1.0)
Monocytes Relative: 8 % (ref 3–12)
NEUTROS ABS: 2.7 10*3/uL (ref 1.7–7.7)
Neutrophils Relative %: 56 % (ref 43–77)
PLATELETS: 237 10*3/uL (ref 150–400)
RBC: 3.97 MIL/uL (ref 3.87–5.11)
RDW: 15.7 % — AB (ref 11.5–15.5)
WBC: 4.7 10*3/uL (ref 4.0–10.5)

## 2013-07-23 NOTE — Progress Notes (Signed)
Labs drawn

## 2013-07-26 ENCOUNTER — Encounter (HOSPITAL_COMMUNITY): Payer: Medicare Other

## 2013-07-26 ENCOUNTER — Encounter (HOSPITAL_COMMUNITY): Payer: Self-pay

## 2013-07-26 ENCOUNTER — Encounter (HOSPITAL_COMMUNITY): Payer: Medicare Other | Attending: Hematology and Oncology

## 2013-07-26 VITALS — BP 146/56 | HR 98 | Temp 98.1°F | Resp 18 | Wt 100.8 lb

## 2013-07-26 DIAGNOSIS — D638 Anemia in other chronic diseases classified elsewhere: Secondary | ICD-10-CM

## 2013-07-26 DIAGNOSIS — C7952 Secondary malignant neoplasm of bone marrow: Secondary | ICD-10-CM

## 2013-07-26 DIAGNOSIS — C50919 Malignant neoplasm of unspecified site of unspecified female breast: Secondary | ICD-10-CM

## 2013-07-26 DIAGNOSIS — I10 Essential (primary) hypertension: Secondary | ICD-10-CM

## 2013-07-26 DIAGNOSIS — E119 Type 2 diabetes mellitus without complications: Secondary | ICD-10-CM

## 2013-07-26 DIAGNOSIS — C7951 Secondary malignant neoplasm of bone: Secondary | ICD-10-CM | POA: Insufficient documentation

## 2013-07-26 MED ORDER — DENOSUMAB 120 MG/1.7ML ~~LOC~~ SOLN
120.0000 mg | Freq: Once | SUBCUTANEOUS | Status: AC
  Administered 2013-07-26: 120 mg via SUBCUTANEOUS
  Filled 2013-07-26: qty 1.7

## 2013-07-26 NOTE — Progress Notes (Signed)
Kelli Lopez presents today for injection per MD orders. Xgeva administered SQ in right Abdomen. Administration without incident. Patient tolerated well.

## 2013-07-26 NOTE — Progress Notes (Signed)
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   CURRENT THERAPY: Letrozole 2.5 mg daily plus Xgeva 120 mg subcutaneously monthly.  INTERVAL HISTORY: Kelli Lopez 80 y.o. female returns for followup of stage IV breast cancer with locally advanced disease and bone metastases while taking letrozole 2.5 mg daily and receiving Xgeva 120 A cutaneously monthly with a history of hypercalcemia at the time of presentation.  She was having more pain in the upper thorax last week but only takes 2 hydrocodone per day. Bowel movements are regular using apple juice. She denies any fever, night sweats, or bleeding from the locally advanced right breast tumor. Appetite has been good with no nausea, vomiting, melena, hematochezia, hematuria, incontinence, vaginal bleeding, lower extremity swelling or redness, PND, orthopnea, or palpitations.  MEDICAL HISTORY: Past Medical History  Diagnosis Date  . Renal disorder   . Diabetes mellitus without complication   . Breast cancer   . 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    ALLERGIES:  has No Known Allergies.  MEDICATIONS: has a current medication list which includes the following prescription(s):  acetaminophen, alprazolam, amlodipine, hydrocodone-acetaminophen, letrozole, losartan-hydrochlorothiazide, metformin, and vitamin d (ergocalciferol), and the following Facility-Administered Medications: denosumab.  SURGICAL HISTORY:  Past Surgical History  Procedure Laterality Date  . No past surgeries    . Breast biopsy Right 05/10/2013    Procedure: BREAST BIOPSY;  Surgeon: Jamesetta So, MD;  Location: [facility];  Service: General;  Laterality: Right;    FAMILY HISTORY: family history includes Stroke in her mother.  SOCIAL HISTORY:  reports that she has never smoked. She has never used smokeless tobacco. She reports that she does not drink alcohol or use illicit drugs.  REVIEW OF SYSTEMS:  Other than that discussed above is noncontributory.  PHYSICAL EXAMINATION: ECOG PERFORMANCE STATUS: 1 - Symptomatic but completely ambulatory  There were no vitals taken for this visit.  GENERAL:alert, no distress and comfortable. Alert and oriented x3. SKIN: skin color, texture, turgor are normal, no rashes or significant lesions EYES: PERLA; Conjunctiva are pink and non-injected, sclera clear SINUSES: No redness or tenderness over maxillary or ethmoid sinuses OROPHARYNX:no exudate, no erythema on lips, buccal mucosa, or tongue. NECK: supple, thyroid normal size, non-tender, without nodularity. No masses CHEST: Left breast without mass. Right breast replaced by a large tumor mass with evidence of skin breakdown in the inner lower quadrant without bleeding and with some evidence of reepithelialization. LYMPH:  no palpable lymphadenopathy in the cervical, axillary or inguinal LUNGS: clear to auscultation and percussion with normal breathing effort HEART: regular rate & rhythm and no murmurs. ABDOMEN:abdomen soft, non-tender and normal bowel sounds MUSCULOSKELETAL:no cyanosis of digits and no clubbing. Range of motion normal.  NEURO: alert & oriented x 3 with fluent speech, no focal motor/sensory  deficits   LABORATORY DATA: Infusion on 07/23/2013  Component Date Value Ref Range Status  . WBC 07/23/2013 4.7  4.0 - 10.5 K/uL Final  . RBC 07/23/2013 3.97  3.87 - 5.11 MIL/uL Final  . Hemoglobin 07/23/2013 10.2* 12.0 - 15.0 g/dL Final  . HCT 07/23/2013 32.7* 36.0 - 46.0 % Final  . MCV 07/23/2013 82.4  78.0 - 100.0 fL Final  . MCH 07/23/2013 25.7* 26.0 - 34.0 pg Final  . MCHC 07/23/2013 31.2  30.0 - 36.0 g/dL Final  . RDW 07/23/2013 15.7* 11.5 - 15.5 % Final  . Platelets 07/23/2013 237  150 - 400 K/uL Final  . Neutrophils Relative % 07/23/2013 56  43 - 77 % Final  . Neutro Abs 07/23/2013 2.7  1.7 - 7.7 K/uL Final  . Lymphocytes Relative 07/23/2013 32  12 - 46 % Final  . Lymphs Abs 07/23/2013 1.5  0.7 - 4.0 K/uL Final  . Monocytes Relative 07/23/2013 8  3 - 12 % Final  . Monocytes Absolute 07/23/2013 0.4  0.1 - 1.0 K/uL Final  . Eosinophils Relative 07/23/2013 4  0 - 5 % Final  . Eosinophils Absolute 07/23/2013 0.2  0.0 - 0.7 K/uL Final  . Basophils Relative 07/23/2013 0  0 - 1 % Final  . Basophils Absolute 07/23/2013 0.0  0.0 - 0.1 K/uL Final  . Sodium 07/23/2013 139  137 - 147 mEq/L Final  . Potassium 07/23/2013 4.2  3.7 - 5.3 mEq/L Final  . Chloride 07/23/2013 103  96 - 112 mEq/L Final  . CO2 07/23/2013 23  19 - 32 mEq/L Final  . Glucose, Bld 07/23/2013 139* 70 - 99 mg/dL Final  . BUN 07/23/2013 13  6 - 23 mg/dL Final  . Creatinine, Ser 07/23/2013 0.78  0.50 - 1.10 mg/dL Final  . Calcium 07/23/2013 9.2  8.4 - 10.5 mg/dL Final  . Total Protein 07/23/2013 8.1  6.0 - 8.3 g/dL Final  . Albumin 07/23/2013 3.1* 3.5 - 5.2 g/dL Final  . AST 07/23/2013 14  0 - 37 U/L Final  . ALT 07/23/2013 5  0 - 35 U/L Final  . Alkaline Phosphatase 07/23/2013 118* 39 - 117 U/L Final  . Total Bilirubin 07/23/2013 0.3  0.3 - 1.2 mg/dL Final  . GFR calc non Af Amer 07/23/2013 76* >90 mL/min Final  . GFR calc Af Amer 07/23/2013 88* >90 mL/min Final   Comment: (NOTE)                          The  eGFR has been calculated using the CKD EPI equation.                          This calculation has not been validated in all clinical situations.                          eGFR's persistently <90 mL/min signify possible Chronic Kidney                          Disease.  Infusion on 06/29/2013  Component Date Value Ref Range Status  . WBC 06/29/2013 5.0  4.0 - 10.5 K/uL Final  . RBC 06/29/2013 4.13  3.87 - 5.11 MIL/uL Final  . Hemoglobin 06/29/2013 11.2* 12.0 - 15.0 g/dL Final  . HCT 06/29/2013 34.2* 36.0 - 46.0 % Final  . MCV 06/29/2013 82.8  78.0 - 100.0 fL Final  .  MCH 06/29/2013 27.1  26.0 - 34.0 pg Final  . MCHC 06/29/2013 32.7  30.0 - 36.0 g/dL Final  . RDW 06/29/2013 15.3  11.5 - 15.5 % Final  . Platelets 06/29/2013 239  150 - 400 K/uL Final  . Neutrophils Relative % 06/29/2013 50  43 - 77 % Final  . Neutro Abs 06/29/2013 2.5  1.7 - 7.7 K/uL Final  . Lymphocytes Relative 06/29/2013 33  12 - 46 % Final  . Lymphs Abs 06/29/2013 1.6  0.7 - 4.0 K/uL Final  . Monocytes Relative 06/29/2013 11  3 - 12 % Final  . Monocytes Absolute 06/29/2013 0.6  0.1 - 1.0 K/uL Final  . Eosinophils Relative 06/29/2013 5  0 - 5 % Final  . Eosinophils Absolute 06/29/2013 0.2  0.0 - 0.7 K/uL Final  . Basophils Relative 06/29/2013 1  0 - 1 % Final  . Basophils Absolute 06/29/2013 0.0  0.0 - 0.1 K/uL Final  . Sodium 06/29/2013 137  137 - 147 mEq/L Final  . Potassium 06/29/2013 3.5* 3.7 - 5.3 mEq/L Final  . Chloride 06/29/2013 100  96 - 112 mEq/L Final  . CO2 06/29/2013 23  19 - 32 mEq/L Final  . Glucose, Bld 06/29/2013 130* 70 - 99 mg/dL Final  . BUN 06/29/2013 16  6 - 23 mg/dL Final  . Creatinine, Ser 06/29/2013 0.88  0.50 - 1.10 mg/dL Final  . Calcium 06/29/2013 9.3  8.4 - 10.5 mg/dL Final  . GFR calc non Af Amer 06/29/2013 60* >90 mL/min Final  . GFR calc Af Amer 06/29/2013 70* >90 mL/min Final   Comment: (NOTE)                          The eGFR has been calculated using the CKD EPI equation.                           This calculation has not been validated in all clinical situations.                          eGFR's persistently <90 mL/min signify possible Chronic Kidney                          Disease.    PATHOLOGY: No new pathology.  Urinalysis    Component Value Date/Time   COLORURINE YELLOW 05/06/2013 1944   APPEARANCEUR CLEAR 05/06/2013 1944   LABSPEC 1.025 05/06/2013 1944   PHURINE 5.5 05/06/2013 1944   GLUCOSEU NEGATIVE 05/06/2013 1944   HGBUR NEGATIVE 05/06/2013 1944   BILIRUBINUR NEGATIVE 05/06/2013 1944   KETONESUR NEGATIVE 05/06/2013 1944   PROTEINUR NEGATIVE 05/06/2013 1944   UROBILINOGEN 1.0 05/06/2013 1944   NITRITE NEGATIVE 05/06/2013 1944   LEUKOCYTESUR NEGATIVE 05/06/2013 1944    RADIOGRAPHIC STUDIES: No results found.  ASSESSMENT:  #1. Stage IV right breast cancer with locally extensive disease and bone metastases, minimally ER positive.  #2. Hypercalcemia, controlled.  #3. Bone pain controlled on analgesics.  #4. Hypertension, controlled.  #5. Diabetes mellitus, type II, non-insulin requiring, controlled  #6. Left heel grade 1 decubitus, improved.    PLAN:  #1. Continue letrozole 2.5 mg daily. #2Delton See 120 mg subcutaneously today along with apple juice. #3. Take hydrocodone 4 times a day by the clock   #4. Followup in 4 weeks with CBC, BMP, and continuing Xgeva.  All questions were answered. The patient knows to call the clinic with any problems, questions or concerns. We can certainly see the patient much sooner if necessary.   I spent 25 minutes counseling the patient face to face. The total time spent in the appointment was 30 minutes.    Farrel Gobble, MD 07/26/2013 1:05 PM  DISCLAIMER:  This note was dictated with voice recognition software.  Similar sounding words can inadvertently be transcribed inaccurately and may not be corrected upon review.

## 2013-07-26 NOTE — Patient Instructions (Signed)
[facility] Discharge Instructions  RECOMMENDATIONS MADE BY THE CONSULTANT AND ANY TEST RESULTS WILL BE SENT TO YOUR REFERRING PHYSICIAN.  Continue letrozole 2.5 mg daily.  Xgeva 120 mg subcutaneously today along with apple juice.   Take hydrocodone 4 times a day by the clock   Followup in 4 weeks with CBC, BMP, and continuing Xgeva.   Thank you for choosing [facility] to provide your oncology and hematology care.  To afford each patient quality time with our providers, please arrive at least 15 minutes before your scheduled appointment time.  With your help, our goal is to use those 15 minutes to complete the necessary work-up to ensure our physicians have the information they need to help with your evaluation and healthcare recommendations.    Effective January 1st, 2014, we ask that you re-schedule your appointment with our physicians should you arrive 10 or more minutes late for your appointment.  We strive to give you quality time with our providers, and arriving late affects you and other patients whose appointments are after yours.    Again, thank you for choosing [facility].  Our hope is that these requests will decrease the amount of time that you wait before being seen by our physicians.       _____________________________________________________________  Should you have questions after your visit to [facility], please contact our office at [phone] between the hours of 8:30 a.m. and 5:00 p.m.  Voicemails left after 4:30 p.m. will not be returned until the following business day.  For prescription refill requests, have your pharmacy contact our office with your prescription refill request.

## 2013-07-27 ENCOUNTER — Other Ambulatory Visit (HOSPITAL_COMMUNITY): Payer: Medicare Other

## 2013-07-28 ENCOUNTER — Ambulatory Visit (HOSPITAL_COMMUNITY): Payer: Medicare Other

## 2013-07-29 ENCOUNTER — Other Ambulatory Visit (HOSPITAL_COMMUNITY): Payer: Self-pay | Admitting: Oncology

## 2013-07-29 DIAGNOSIS — E119 Type 2 diabetes mellitus without complications: Secondary | ICD-10-CM

## 2013-07-29 DIAGNOSIS — C50919 Malignant neoplasm of unspecified site of unspecified female breast: Secondary | ICD-10-CM

## 2013-07-29 DIAGNOSIS — D638 Anemia in other chronic diseases classified elsewhere: Secondary | ICD-10-CM

## 2013-07-29 MED ORDER — LETROZOLE 2.5 MG PO TABS: 2.5000 mg | ORAL_TABLET | Freq: Every day | ORAL | Status: AC

## 2013-08-20 ENCOUNTER — Encounter (HOSPITAL_BASED_OUTPATIENT_CLINIC_OR_DEPARTMENT_OTHER): Payer: Medicare Other

## 2013-08-20 DIAGNOSIS — C7951 Secondary malignant neoplasm of bone: Secondary | ICD-10-CM

## 2013-08-20 DIAGNOSIS — C50919 Malignant neoplasm of unspecified site of unspecified female breast: Secondary | ICD-10-CM

## 2013-08-20 LAB — CBC WITH DIFFERENTIAL/PLATELET
Basophils Absolute: 0 10*3/uL (ref 0.0–0.1)
Basophils Relative: 1 % (ref 0–1)
EOS ABS: 0.2 10*3/uL (ref 0.0–0.7)
Eosinophils Relative: 4 % (ref 0–5)
HEMATOCRIT: 33.6 % — AB (ref 36.0–46.0)
HEMOGLOBIN: 10.8 g/dL — AB (ref 12.0–15.0)
Lymphocytes Relative: 38 % (ref 12–46)
Lymphs Abs: 2 10*3/uL (ref 0.7–4.0)
MCH: 26.4 pg (ref 26.0–34.0)
MCHC: 32.1 g/dL (ref 30.0–36.0)
MCV: 82.2 fL (ref 78.0–100.0)
MONO ABS: 0.6 10*3/uL (ref 0.1–1.0)
MONOS PCT: 11 % (ref 3–12)
NEUTROS ABS: 2.4 10*3/uL (ref 1.7–7.7)
Neutrophils Relative %: 46 % (ref 43–77)
Platelets: 267 10*3/uL (ref 150–400)
RBC: 4.09 MIL/uL (ref 3.87–5.11)
RDW: 16.3 % — ABNORMAL HIGH (ref 11.5–15.5)
WBC: 5.2 10*3/uL (ref 4.0–10.5)

## 2013-08-20 LAB — BASIC METABOLIC PANEL
BUN: 7 mg/dL (ref 6–23)
CHLORIDE: 104 meq/L (ref 96–112)
CO2: 23 meq/L (ref 19–32)
Calcium: 9 mg/dL (ref 8.4–10.5)
Creatinine, Ser: 0.67 mg/dL (ref 0.50–1.10)
GFR calc Af Amer: >90 mL/min (ref >90)
GFR calc non Af Amer: 80 mL/min — ABNORMAL LOW (ref >90)
Glucose, Bld: 101 mg/dL — ABNORMAL HIGH (ref 70–99)
Potassium: 3.8 mEq/L (ref 3.7–5.3)
Sodium: 142 mEq/L (ref 137–147)

## 2013-08-20 NOTE — Progress Notes (Signed)
LABS DRAWN FOR CBC BMP

## 2013-08-23 ENCOUNTER — Encounter (HOSPITAL_BASED_OUTPATIENT_CLINIC_OR_DEPARTMENT_OTHER): Payer: Medicare Other

## 2013-08-23 ENCOUNTER — Encounter (HOSPITAL_COMMUNITY): Payer: Self-pay

## 2013-08-23 ENCOUNTER — Encounter (HOSPITAL_COMMUNITY): Payer: Medicare Other

## 2013-08-23 VITALS — BP 152/66 | HR 106 | Temp 97.9°F | Resp 18 | Wt 98.0 lb

## 2013-08-23 DIAGNOSIS — C7952 Secondary malignant neoplasm of bone marrow: Secondary | ICD-10-CM

## 2013-08-23 DIAGNOSIS — Z170 Estrogen receptor positive status [ER+]: Secondary | ICD-10-CM

## 2013-08-23 DIAGNOSIS — E119 Type 2 diabetes mellitus without complications: Secondary | ICD-10-CM

## 2013-08-23 DIAGNOSIS — I10 Essential (primary) hypertension: Secondary | ICD-10-CM

## 2013-08-23 DIAGNOSIS — D638 Anemia in other chronic diseases classified elsewhere: Secondary | ICD-10-CM

## 2013-08-23 DIAGNOSIS — C7951 Secondary malignant neoplasm of bone: Secondary | ICD-10-CM

## 2013-08-23 DIAGNOSIS — C50911 Malignant neoplasm of unspecified site of right female breast: Secondary | ICD-10-CM

## 2013-08-23 DIAGNOSIS — C50919 Malignant neoplasm of unspecified site of unspecified female breast: Secondary | ICD-10-CM

## 2013-08-23 MED ORDER — DENOSUMAB 120 MG/1.7ML ~~LOC~~ SOLN
120.0000 mg | Freq: Once | SUBCUTANEOUS | Status: AC
  Administered 2013-08-23: 120 mg via SUBCUTANEOUS
  Filled 2013-08-23: qty 1.7

## 2013-08-23 MED ORDER — CAMPHOR-MENTHOL 0.5-0.5 % EX LOTN: 1.0000 "application " | TOPICAL_LOTION | CUTANEOUS | Status: AC | PRN

## 2013-08-23 MED ORDER — HYDROCODONE-ACETAMINOPHEN 7.5-325 MG PO TABS: 1.0000 | ORAL_TABLET | ORAL | Status: DC | PRN

## 2013-08-23 NOTE — Patient Instructions (Signed)
[facility] Discharge Instructions  RECOMMENDATIONS MADE BY THE CONSULTANT AND ANY TEST RESULTS WILL BE SENT TO YOUR REFERRING PHYSICIAN.  We will see you in 4 weeks for repeat labs and a doctor appointment. Please call for any questions or concerns. Continue your medications as ordered.   Thank you for choosing [facility] to provide your oncology and hematology care.  To afford each patient quality time with our providers, please arrive at least 15 minutes before your scheduled appointment time.  With your help, our goal is to use those 15 minutes to complete the necessary work-up to ensure our physicians have the information they need to help with your evaluation and healthcare recommendations.    Effective January 1st, 2014, we ask that you re-schedule your appointment with our physicians should you arrive 10 or more minutes late for your appointment.  We strive to give you quality time with our providers, and arriving late affects you and other patients whose appointments are after yours.    Again, thank you for choosing [facility].  Our hope is that these requests will decrease the amount of time that you wait before being seen by our physicians.       _____________________________________________________________  Should you have questions after your visit to [facility], please contact our office at [phone] between the hours of 8:30 a.m. and 4:30 p.m.  Voicemails left after 4:30 p.m. will not be returned until the following business day.  For prescription refill requests, have your pharmacy contact our office with your prescription refill request.    _______________________________________________________________  We hope that we have given you very good care.  You may receive a patient satisfaction survey in the mail, please complete it and return it as soon as possible.  We value your  feedback!  _______________________________________________________________  Have you asked about our STAR program?  STAR stands for Survivorship Training and Rehabilitation, and this is a nationally recognized cancer care program that focuses on survivorship and rehabilitation.  Cancer and cancer treatments may cause problems, such as, pain, making you feel tired and keeping you from doing the things that you need or want to do. Cancer rehabilitation can help. Our goal is to reduce these troubling effects and help you have the best quality of life possible.  You may receive a survey from a nurse that asks questions about your current state of health.  Based on the survey results, all eligible patients will be referred to the [facility] program for an evaluation so we can better serve you!  A frequently asked questions sheet is available upon request.

## 2013-08-23 NOTE — Progress Notes (Signed)
Kelli Lopez's reason for visit today is for an injection and labs as scheduled per MD orders.  Labs were drawn prior to administration of ordered medication.  Kelli Lopez also received Delton See 120mg   per MD orders; see [facility] for administration details.  Kelli Lopez tolerated all procedures well and without incident; questions were answered and patient was discharged.

## 2013-08-23 NOTE — Progress Notes (Signed)
[facility]  OFFICE PROGRESS [name], MD [address] [place] [address]  DIAGNOSIS: Breast cancer, right - Plan: CBC with Differential, Comprehensive metabolic panel, Cancer antigen 27.29, CEA  Bone metastases - Plan: CBC with Differential, Comprehensive metabolic panel, Cancer antigen 27.29, CEA  Hypercalcemia - Plan: Comprehensive metabolic panel  Anemia of chronic disease  Diabetes mellitus type II, non insulin dependent  No chief complaint on file.   CURRENT THERAPY: Letrozole 2.5 mg daily plus Xgeva 120 mg subcutaneously monthly.  INTERVAL HISTORY: Kelli Lopez 80 y.o. female returns for followup of stage IV breast cancer with locally advanced disease and bone metastases while taking letrozole 2.5 mg daily and receiving Xgeva 120 mg subcutaneously monthly with a history of hypercalcemia at the time of presentation.  Pain is much better controlled taking hydrocodone by the clock. She denies any incontinence, diarrhea, constipation, melena, hematochezia, swelling of the right upper extremity, bleeding from the right breast mass, fever, night sweats, vaginal dryness, lower extremity swelling or redness, lymphedema, skin rash, headache, or seizures.  MEDICAL HISTORY: Past Medical History  Diagnosis Date  . Renal disorder   . Diabetes mellitus without complication   . Breast cancer   . 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    ALLERGIES:  has No Known Allergies.  MEDICATIONS: has a current medication list which includes the following prescription(s): acetaminophen, alprazolam, amlodipine, hydrocodone-acetaminophen, letrozole, losartan-hydrochlorothiazide, metformin, and vitamin d (ergocalciferol).  SURGICAL HISTORY:  Past  Surgical History  Procedure Laterality Date  . No past surgeries    . Breast biopsy Right 05/10/2013    Procedure: BREAST BIOPSY;  Surgeon: Jamesetta So, MD;  Location: [facility];  Service: General;  Laterality: Right;    FAMILY HISTORY: family history includes Stroke in her mother.  SOCIAL HISTORY:  reports that she has never smoked. She has never used smokeless tobacco. She reports that she does not drink alcohol or use illicit drugs.  REVIEW OF SYSTEMS:  Other than that discussed above is noncontributory.  PHYSICAL EXAMINATION: ECOG PERFORMANCE STATUS: 1 - Symptomatic but completely ambulatory  There were no vitals taken for this visit.  GENERAL:alert, no distress and comfortable SKIN: skin color, texture, turgor are normal, no rashes or significant lesions EYES: PERLA; Conjunctiva are pink and non-injected, sclera clear SINUSES: No redness or tenderness over maxillary or ethmoid sinuses OROPHARYNX:no exudate, no erythema on lips, buccal mucosa, or tongue. NECK: supple, thyroid normal size, non-tender, without nodularity. No masses CHEST: Locally advanced right breast cancer with induration present but no bleeding or purulence. Right axillary mass measuring 2.5 cm. LYMPH:  no palpable lymphadenopathy in the cervical, axillary or inguinal LUNGS: clear to auscultation and percussion with normal breathing effort HEART: regular rate & rhythm and no murmurs. ABDOMEN:abdomen soft, non-tender and normal bowel sounds. Soft with no organomegaly, ascites, or CVA tenderness. MUSCULOSKELETAL:no cyanosis of digits and no clubbing. Range of motion normal.  NEURO: alert & oriented x 3 with fluent speech, no focal motor/sensory deficits   LABORATORY DATA: Lab on 08/20/2013  Component Date Value Ref Range Status  . Sodium 08/20/2013 142  137 - 147 mEq/L Final  . Potassium 08/20/2013 3.8  3.7 - 5.3 mEq/L Final  . Chloride 08/20/2013 104  96 - 112 mEq/L Final  . CO2  08/20/2013 23  19 - 32 mEq/L  Final  . Glucose, Bld 08/20/2013 101* 70 - 99 mg/dL Final  . BUN 08/20/2013 7  6 - 23 mg/dL Final  . Creatinine, Ser 08/20/2013 0.67  0.50 - 1.10 mg/dL Final  . Calcium 08/20/2013 9.0  8.4 - 10.5 mg/dL Final  . GFR calc non Af Amer 08/20/2013 80* >90 mL/min Final  . GFR calc Af Amer 08/20/2013 >90  >90 mL/min Final   Comment: (NOTE)                          The eGFR has been calculated using the CKD EPI equation.                          This calculation has not been validated in all clinical situations.                          eGFR's persistently <90 mL/min signify possible Chronic Kidney                          Disease.  . WBC 08/20/2013 5.2  4.0 - 10.5 K/uL Final  . RBC 08/20/2013 4.09  3.87 - 5.11 MIL/uL Final  . Hemoglobin 08/20/2013 10.8* 12.0 - 15.0 g/dL Final  . HCT 08/20/2013 33.6* 36.0 - 46.0 % Final  . MCV 08/20/2013 82.2  78.0 - 100.0 fL Final  . MCH 08/20/2013 26.4  26.0 - 34.0 pg Final  . MCHC 08/20/2013 32.1  30.0 - 36.0 g/dL Final  . RDW 08/20/2013 16.3* 11.5 - 15.5 % Final  . Platelets 08/20/2013 267  150 - 400 K/uL Final  . Neutrophils Relative % 08/20/2013 46  43 - 77 % Final  . Neutro Abs 08/20/2013 2.4  1.7 - 7.7 K/uL Final  . Lymphocytes Relative 08/20/2013 38  12 - 46 % Final  . Lymphs Abs 08/20/2013 2.0  0.7 - 4.0 K/uL Final  . Monocytes Relative 08/20/2013 11  3 - 12 % Final  . Monocytes Absolute 08/20/2013 0.6  0.1 - 1.0 K/uL Final  . Eosinophils Relative 08/20/2013 4  0 - 5 % Final  . Eosinophils Absolute 08/20/2013 0.2  0.0 - 0.7 K/uL Final  . Basophils Relative 08/20/2013 1  0 - 1 % Final  . Basophils Absolute 08/20/2013 0.0  0.0 - 0.1 K/uL Final    PATHOLOGY: No new pathology.  Urinalysis    Component Value Date/Time   COLORURINE YELLOW 05/06/2013 1944   APPEARANCEUR CLEAR 05/06/2013 1944   LABSPEC 1.025 05/06/2013 1944   PHURINE 5.5 05/06/2013 1944   GLUCOSEU NEGATIVE 05/06/2013 1944   HGBUR NEGATIVE 05/06/2013 1944   BILIRUBINUR NEGATIVE  05/06/2013 1944   KETONESUR NEGATIVE 05/06/2013 1944   PROTEINUR NEGATIVE 05/06/2013 1944   UROBILINOGEN 1.0 05/06/2013 1944   NITRITE NEGATIVE 05/06/2013 1944   LEUKOCYTESUR NEGATIVE 05/06/2013 1944    RADIOGRAPHIC STUDIES: No results found.  ASSESSMENT:  #1. Stage IV right breast cancer with locally extensive disease and bone metastases, minimally ER positive.  #2. Hypercalcemia, controlled.  #3. Bone pain controlled on analgesics.  #4. Hypertension, controlled.  #5. Diabetes mellitus, type II, non-insulin requiring, controlled      PLAN:  #1. Xgeva 120 mg subcutaneously. #2. Hydrocodone every 4 hours around the clock or with 2 at bedtime. #3. Letrozole 2.5 mg daily. #4. Followup in  4 weeks with CBC, BMP, additional Xgeva.   All questions were answered. The patient knows to call the clinic with any problems, questions or concerns. We can certainly see the patient much sooner if necessary.   I spent 25 minutes counseling the patient face to face. The total time spent in the appointment was 30 minutes.    Doroteo Bradford, MD 08/23/2013 1:32 PM  DISCLAIMER:  This note was dictated with voice recognition software.  Similar sounding words can inadvertently be transcribed inaccurately and may not be corrected upon review.

## 2013-09-17 ENCOUNTER — Other Ambulatory Visit (HOSPITAL_COMMUNITY): Payer: Medicare Other

## 2013-09-20 ENCOUNTER — Encounter (HOSPITAL_COMMUNITY): Payer: Medicare Other | Attending: Hematology and Oncology

## 2013-09-20 ENCOUNTER — Encounter (HOSPITAL_BASED_OUTPATIENT_CLINIC_OR_DEPARTMENT_OTHER): Payer: Medicare Other

## 2013-09-20 ENCOUNTER — Encounter (HOSPITAL_COMMUNITY): Payer: Self-pay

## 2013-09-20 ENCOUNTER — Encounter (HOSPITAL_COMMUNITY): Payer: Medicare Other

## 2013-09-20 VITALS — Wt 94.4 lb

## 2013-09-20 DIAGNOSIS — C7952 Secondary malignant neoplasm of bone marrow: Secondary | ICD-10-CM

## 2013-09-20 DIAGNOSIS — C50911 Malignant neoplasm of unspecified site of right female breast: Secondary | ICD-10-CM

## 2013-09-20 DIAGNOSIS — C7951 Secondary malignant neoplasm of bone: Secondary | ICD-10-CM | POA: Diagnosis present

## 2013-09-20 DIAGNOSIS — Z170 Estrogen receptor positive status [ER+]: Secondary | ICD-10-CM

## 2013-09-20 DIAGNOSIS — R634 Abnormal weight loss: Secondary | ICD-10-CM

## 2013-09-20 DIAGNOSIS — R630 Anorexia: Secondary | ICD-10-CM | POA: Diagnosis present

## 2013-09-20 DIAGNOSIS — C50919 Malignant neoplasm of unspecified site of unspecified female breast: Secondary | ICD-10-CM

## 2013-09-20 LAB — CBC WITH DIFFERENTIAL/PLATELET
Basophils Absolute: 0 K/uL (ref 0.0–0.1)
Basophils Relative: 1 % (ref 0–1)
Eosinophils Absolute: 0.2 K/uL (ref 0.0–0.7)
Eosinophils Relative: 4 % (ref 0–5)
HCT: 33.2 % — ABNORMAL LOW (ref 36.0–46.0)
Hemoglobin: 10.6 g/dL — ABNORMAL LOW (ref 12.0–15.0)
Lymphocytes Relative: 33 % (ref 12–46)
Lymphs Abs: 1.6 K/uL (ref 0.7–4.0)
MCH: 26.8 pg (ref 26.0–34.0)
MCHC: 31.9 g/dL (ref 30.0–36.0)
MCV: 84.1 fL (ref 78.0–100.0)
Monocytes Absolute: 0.5 K/uL (ref 0.1–1.0)
Monocytes Relative: 11 % (ref 3–12)
Neutro Abs: 2.5 K/uL (ref 1.7–7.7)
Neutrophils Relative %: 53 % (ref 43–77)
Platelets: 224 K/uL (ref 150–400)
RBC: 3.95 MIL/uL (ref 3.87–5.11)
RDW: 16.7 % — ABNORMAL HIGH (ref 11.5–15.5)
WBC: 4.7 K/uL (ref 4.0–10.5)

## 2013-09-20 LAB — COMPREHENSIVE METABOLIC PANEL
ALBUMIN: 2.9 g/dL — AB (ref 3.5–5.2)
ALT: 7 U/L (ref 0–35)
AST: 20 U/L (ref 0–37)
Alkaline Phosphatase: 91 U/L (ref 39–117)
Anion gap: 13 (ref 5–15)
BILIRUBIN TOTAL: 0.3 mg/dL (ref 0.3–1.2)
BUN: 6 mg/dL (ref 6–23)
CO2: 24 mEq/L (ref 19–32)
Calcium: 9.5 mg/dL (ref 8.4–10.5)
Chloride: 105 mEq/L (ref 96–112)
Creatinine, Ser: 0.68 mg/dL (ref 0.50–1.10)
GFR calc Af Amer: >90 mL/min (ref >90)
GFR calc non Af Amer: 80 mL/min — ABNORMAL LOW (ref >90)
Glucose, Bld: 181 mg/dL — ABNORMAL HIGH (ref 70–99)
Potassium: 3.3 mEq/L — ABNORMAL LOW (ref 3.7–5.3)
Sodium: 142 mEq/L (ref 137–147)
TOTAL PROTEIN: 7.9 g/dL (ref 6.0–8.3)

## 2013-09-20 MED ORDER — PREDNISONE 5 MG PO TABS: 5.0000 mg | ORAL_TABLET | Freq: Every day | ORAL | Status: AC

## 2013-09-20 MED ORDER — POTASSIUM CHLORIDE CRYS ER 20 MEQ PO TBCR: 20.0000 meq | EXTENDED_RELEASE_TABLET | Freq: Every day | ORAL | Status: AC

## 2013-09-20 MED ORDER — DENOSUMAB 120 MG/1.7ML ~~LOC~~ SOLN
120.0000 mg | Freq: Once | SUBCUTANEOUS | Status: AC
  Administered 2013-09-20: 120 mg via SUBCUTANEOUS
  Filled 2013-09-20: qty 1.7

## 2013-09-20 NOTE — Patient Instructions (Signed)
[facility] Discharge Instructions  RECOMMENDATIONS MADE BY THE CONSULTANT AND ANY TEST RESULTS WILL BE SENT TO YOUR REFERRING PHYSICIAN.  EXAM FINDINGS BY THE PHYSICIAN TODAY AND SIGNS OR SYMPTOMS TO REPORT TO CLINIC OR PRIMARY PHYSICIAN: Exam and findings as discussed by Dr. Bubba Hales.  We will give your xgeva today and will put you on some medication for your low potassium and a medication to see if it will improve your appetite and energy level.  MEDICATIONS PRESCRIBED:  Potassium 20 meq take 1 daily for 14 days Prednisone 5 mg daily for 10 days.  INSTRUCTIONS/FOLLOW-UP: Follow-up in 2 week with labs and office visit in 4 weeks.  Thank you for choosing [facility] to provide your oncology and hematology care.  To afford each patient quality time with our providers, please arrive at least 15 minutes before your scheduled appointment time.  With your help, our goal is to use those 15 minutes to complete the necessary work-up to ensure our physicians have the information they need to help with your evaluation and healthcare recommendations.    Effective January 1st, 2014, we ask that you re-schedule your appointment with our physicians should you arrive 10 or more minutes late for your appointment.  We strive to give you quality time with our providers, and arriving late affects you and other patients whose appointments are after yours.    Again, thank you for choosing [facility]  Our hope is that these requests will decrease the amount of time that you wait before being seen by our physicians.       _____________________________________________________________  Should you have questions after your visit to [facility], please contact our office at [phone] between the hours of 8:30 a.m. and 4:30 p.m.  Voicemails left after 4:30 p.m. will not be returned until the following business day.  For prescription refill requests, have  your pharmacy contact our office with your prescription refill request.    _______________________________________________________________  We hope that we have given you very good care.  You may receive a patient satisfaction survey in the mail, please complete it and return it as soon as possible.  We value your feedback!  _______________________________________________________________  Have you asked about our STAR program?  STAR stands for Survivorship Training and Rehabilitation, and this is a nationally recognized cancer care program that focuses on survivorship and rehabilitation.  Cancer and cancer treatments may cause problems, such as, pain, making you feel tired and keeping you from doing the things that you need or want to do. Cancer rehabilitation can help. Our goal is to reduce these troubling effects and help you have the best quality of life possible.  You may receive a survey from a nurse that asks questions about your current state of health.  Based on the survey results, all eligible patients will be referred to the [facility] program for an evaluation so we can better serve you!  A frequently asked questions sheet is available upon request.

## 2013-09-20 NOTE — Progress Notes (Signed)
Kelli Lopez presents today for injection per MD orders. xgeva 120 mg administered SQ in right Abdomen. Administration without incident. Patient tolerated well.

## 2013-09-20 NOTE — Progress Notes (Signed)
[name] OFFICE PROGRESS NOTE  PCP Rosita Fire, MD [address] [place] [address]  DIAGNOSIS: Breast cancer, right - Plan: CBC with Differential, Comprehensive metabolic panel, Cancer antigen 27.29, CEA, potassium chloride SA (K-DUR,KLOR-CON) 20 MEQ tablet, denosumab (XGEVA) injection 120 mg  Bone metastases - Plan: CBC with Differential, Comprehensive metabolic panel, Cancer antigen 27.29, CEA, potassium chloride SA (K-DUR,KLOR-CON) 20 MEQ tablet  Hypercalcemia - Plan: CBC with Differential, Comprehensive metabolic panel, Cancer antigen 27.29, CEA, potassium chloride SA (K-DUR,KLOR-CON) 20 MEQ tablet, predniSONE (DELTASONE) 5 MG tablet, denosumab (XGEVA) injection 120 mg  Loss of appetite - Plan: predniSONE (DELTASONE) 5 MG tablet  CURRENT THERAPY:  Letrozole 2.5 mg daily plus Xgeva 120 mg subcutaneously monthly.   INTERVAL HEMATOLOGY/ONCOLOGY HX: Kelli Lopez 80 y.o. female returns for followup of stage IV breast cancer with locally advanced disease and bone metastases while taking letrozole 2.5 mg daily and receiving Xgeva 120 mg subcutaneously monthly with a history of hypercalcemia at the time of presentation.  Pain is much better controlled taking hydrocodone. She denies any incontinence, diarrhea, constipation, melena, hematochezia, swelling of the right upper extremity, bleeding from the right breast mass, fever, night sweats, vaginal dryness, lower extremity swelling or redness, lymphedema, skin rash, headache, or seizures. Family is present and confirm that Kelli Lopez is not experiencing confusion. Her appetite is fair.      MEDICAL HISTORY:  Past Medical History  Diagnosis Date  . Renal disorder   . Diabetes mellitus without complication   . Breast cancer   . Hypertension     has Breast CA; Hypercalcemia; HTN (hypertension); Breast cancer, inflammatory, ER+, HER-2 negative, Ki67=36%; and Diabetes mellitus type  II, non insulin dependent on her problem list.    ALLERGIES:  has No Known Allergies.  MEDICATIONS: has a current medication list which includes the following prescription(s): acetaminophen, alprazolam, amlodipine, camphor-menthol, hydrocodone-acetaminophen, letrozole, metformin, vitamin d (ergocalciferol), losartan-hydrochlorothiazide, potassium chloride sa, and prednisone.  FAMILY HISTORY: family history includes Stroke in her mother.  REVIEW OF SYSTEMS:    SINCE YOUR LAST VISIT Been diagnosed or treated for a new medical /surgical  problem or condition: No Any Recent Xrays or studies performed: Yes Any new prescription or OTC medications: No ECOG Perf Status: Ambulatory and capable of all selfcare but unable to carry out any work activities.  Up and about more than 50% of waking hours Problems sleeping: No Medications taken to help sleep: Yes How is your appetie:  (30%) Any Supplements: No Any trouble chewing or swallowing: No Any Nausea or Vomiting: No Any Bowel problems: No # Bowel Movements per week: 7 Any Urinary Issues: No Any Cardiac Problems: No Any Respiratory Issues: Yes (exertional dyspnea) Any Neurological Issues: Yes (some confusion at times) Do you live alone: No Feelings hopelessness: No You or your family have any concerns or Health changes: No Pain Assessment Pain Score: 0-No pain  Other than that discussed above is noncontributory.    PHYSICAL EXAMINATION:   weight is 94 lb 6.4 oz (42.82 kg).    GENERAL: alert, no distress and comfortable. Transfers with assistance.  SKIN: skin color, texture, turgor are normal, no rashes. EYES: PERLA; Conjunctiva are pink and non-injected, sclera clear OROPHARYNX: no exudate, no erythema on lips, buccal mucosa, or tongue. NECK: supple, thyroid normal size, non-tender, without nodularity. No masses LYMPH:  palpable lymphadenopathy in the right axilla as before. Hard and nontender LUNGS: clear to auscultation and  percussion with normal breathing effort  HEART: regular rate & rhythm and no murmurs.  BREASTS: Right breast is fixed and replaced with a mass. No bleeding. Presumed ulcerated area has healed. ABDOMEN: abdomen soft, non-tender and normal bowel sounds,  MUSCULOSKELETAL/EXTREMITIES: No spine or CVA tenderness. No lymphedema. NEURO: alert & oriented x 3 with fluent speech  LABORATORY DATA: Office Visit on 09/20/2013  Component Date Value Ref Range Status  . WBC 09/20/2013 4.7  4.0 - 10.5 K/uL Final  . RBC 09/20/2013 3.95  3.87 - 5.11 MIL/uL Final  . Hemoglobin 09/20/2013 10.6* 12.0 - 15.0 g/dL Final  . HCT 09/20/2013 33.2* 36.0 - 46.0 % Final  . MCV 09/20/2013 84.1  78.0 - 100.0 fL Final  . MCH 09/20/2013 26.8  26.0 - 34.0 pg Final  . MCHC 09/20/2013 31.9  30.0 - 36.0 g/dL Final  . RDW 09/20/2013 16.7* 11.5 - 15.5 % Final  . Platelets 09/20/2013 224  150 - 400 K/uL Final  . Neutrophils Relative % 09/20/2013 53  43 - 77 % Final  . Neutro Abs 09/20/2013 2.5  1.7 - 7.7 K/uL Final  . Lymphocytes Relative 09/20/2013 33  12 - 46 % Final  . Lymphs Abs 09/20/2013 1.6  0.7 - 4.0 K/uL Final  . Monocytes Relative 09/20/2013 11  3 - 12 % Final  . Monocytes Absolute 09/20/2013 0.5  0.1 - 1.0 K/uL Final  . Eosinophils Relative 09/20/2013 4  0 - 5 % Final  . Eosinophils Absolute 09/20/2013 0.2  0.0 - 0.7 K/uL Final  . Basophils Relative 09/20/2013 1  0 - 1 % Final  . Basophils Absolute 09/20/2013 0.0  0.0 - 0.1 K/uL Final  . Sodium 09/20/2013 142  137 - 147 mEq/L Final  . Potassium 09/20/2013 3.3* 3.7 - 5.3 mEq/L Final  . Chloride 09/20/2013 105  96 - 112 mEq/L Final  . CO2 09/20/2013 24  19 - 32 mEq/L Final  . Glucose, Bld 09/20/2013 181* 70 - 99 mg/dL Final  . BUN 09/20/2013 6  6 - 23 mg/dL Final  . Creatinine, Ser 09/20/2013 0.68  0.50 - 1.10 mg/dL Final  . Calcium 09/20/2013 9.5  8.4 - 10.5 mg/dL Final  . Total Protein 09/20/2013 7.9  6.0 - 8.3 g/dL Final  . Albumin 09/20/2013 2.9* 3.5 - 5.2  g/dL Final  . AST 09/20/2013 20  0 - 37 U/L Final  . ALT 09/20/2013 7  0 - 35 U/L Final  . Alkaline Phosphatase 09/20/2013 91  39 - 117 U/L Final  . Total Bilirubin 09/20/2013 0.3  0.3 - 1.2 mg/dL Final  . GFR calc non Af Amer 09/20/2013 80* >90 mL/min Final  . GFR calc Af Amer 09/20/2013 >90  >90 mL/min Final   Comment: (NOTE)                          The eGFR has been calculated using the CKD EPI equation.                          This calculation has not been validated in all clinical situations.                          eGFR's persistently <90 mL/min signify possible Chronic Kidney                          Disease.  Georgiann Hahn  gap 09/20/2013 13  5 - 15 Final     RADIOGRAPHIC STUDIES: No results found.  ASSESSMENT: 1. Stage IV right breast cancer with locally extensive disease and bone metastases, minimally ER positive.   2. Hypercalcemia, controlled.   3. Bone pain controlled on analgesics. 4. Weight loss     PLAN:  1. Xgeva 120 mg subcutaneously.  2. Hydrocodone as needed for pain  3. Letrozole 2.5 mg daily. Patient not interested in chemotherapy. Hopefully can avoid toilet mastectomy or radiation. 4. Discussed nutritional supplements with family. Trial of low dose prednisone. Replete potassium and discussed nutritional supplements with family.     The patient knows to call the clinic with any problems, questions or concerns. We can certainly see the patient much sooner if necessary.    Darrall Dears, MD 09/20/2013 7:59 PM

## 2013-09-20 NOTE — Progress Notes (Signed)
LABS DRAWN FOR CBCD,CMP,[id number],CEA

## 2013-09-21 LAB — CANCER ANTIGEN 27.29: CA 27.29: 81 U/mL — AB (ref 0–39)

## 2013-09-21 LAB — CEA: CEA: 4.4 ng/mL (ref 0.0–5.0)

## 2013-09-22 ENCOUNTER — Other Ambulatory Visit (HOSPITAL_COMMUNITY): Payer: Self-pay

## 2013-09-22 DIAGNOSIS — E876 Hypokalemia: Secondary | ICD-10-CM

## 2013-10-01 ENCOUNTER — Telehealth (HOSPITAL_COMMUNITY): Payer: Self-pay | Admitting: Oncology

## 2013-10-01 ENCOUNTER — Emergency Department (HOSPITAL_COMMUNITY)
Admission: EM | Admit: 2013-10-01 | Discharge: 2013-10-01 | Disposition: A | Discharge status: Home / Self Care | Payer: Medicare Other | Attending: Emergency Medicine | Admitting: Emergency Medicine

## 2013-10-01 ENCOUNTER — Emergency Department (HOSPITAL_COMMUNITY): Payer: Medicare Other

## 2013-10-01 DIAGNOSIS — R059 Cough, unspecified: Secondary | ICD-10-CM | POA: Diagnosis not present

## 2013-10-01 DIAGNOSIS — R05 Cough: Secondary | ICD-10-CM | POA: Insufficient documentation

## 2013-10-01 DIAGNOSIS — R0600 Dyspnea, unspecified: Secondary | ICD-10-CM

## 2013-10-01 DIAGNOSIS — R109 Unspecified abdominal pain: Secondary | ICD-10-CM | POA: Insufficient documentation

## 2013-10-01 DIAGNOSIS — Z87448 Personal history of other diseases of urinary system: Secondary | ICD-10-CM | POA: Diagnosis not present

## 2013-10-01 DIAGNOSIS — I10 Essential (primary) hypertension: Secondary | ICD-10-CM | POA: Diagnosis not present

## 2013-10-01 DIAGNOSIS — R339 Retention of urine, unspecified: Secondary | ICD-10-CM | POA: Insufficient documentation

## 2013-10-01 DIAGNOSIS — R0609 Other forms of dyspnea: Secondary | ICD-10-CM | POA: Insufficient documentation

## 2013-10-01 DIAGNOSIS — R0602 Shortness of breath: Secondary | ICD-10-CM | POA: Diagnosis present

## 2013-10-01 DIAGNOSIS — E119 Type 2 diabetes mellitus without complications: Secondary | ICD-10-CM | POA: Diagnosis not present

## 2013-10-01 DIAGNOSIS — R0989 Other specified symptoms and signs involving the circulatory and respiratory systems: Secondary | ICD-10-CM | POA: Diagnosis not present

## 2013-10-01 DIAGNOSIS — IMO0002 Reserved for concepts with insufficient information to code with codable children: Secondary | ICD-10-CM | POA: Diagnosis not present

## 2013-10-01 DIAGNOSIS — C50919 Malignant neoplasm of unspecified site of unspecified female breast: Secondary | ICD-10-CM

## 2013-10-01 DIAGNOSIS — Z9889 Other specified postprocedural states: Secondary | ICD-10-CM | POA: Diagnosis not present

## 2013-10-01 DIAGNOSIS — Z79899 Other long term (current) drug therapy: Secondary | ICD-10-CM | POA: Diagnosis not present

## 2013-10-01 LAB — CBC WITH DIFFERENTIAL/PLATELET
Basophils Absolute: 0 10*3/uL (ref 0.0–0.1)
Basophils Relative: 1 % (ref 0–1)
Eosinophils Absolute: 0.2 10*3/uL (ref 0.0–0.7)
Eosinophils Relative: 3 % (ref 0–5)
HCT: 33.1 % — ABNORMAL LOW (ref 36.0–46.0)
Hemoglobin: 10.5 g/dL — ABNORMAL LOW (ref 12.0–15.0)
LYMPHS ABS: 2.1 10*3/uL (ref 0.7–4.0)
Lymphocytes Relative: 37 % (ref 12–46)
MCH: 26.7 pg (ref 26.0–34.0)
MCHC: 31.7 g/dL (ref 30.0–36.0)
MCV: 84.2 fL (ref 78.0–100.0)
MONOS PCT: 12 % (ref 3–12)
Monocytes Absolute: 0.7 10*3/uL (ref 0.1–1.0)
Neutro Abs: 2.7 10*3/uL (ref 1.7–7.7)
Neutrophils Relative %: 47 % (ref 43–77)
Platelets: 223 10*3/uL (ref 150–400)
RBC: 3.93 MIL/uL (ref 3.87–5.11)
RDW: 17.2 % — ABNORMAL HIGH (ref 11.5–15.5)
WBC: 5.6 10*3/uL (ref 4.0–10.5)

## 2013-10-01 LAB — BASIC METABOLIC PANEL
Anion gap: 13 (ref 5–15)
BUN: 9 mg/dL (ref 6–23)
CHLORIDE: 105 meq/L (ref 96–112)
CO2: 22 mEq/L (ref 19–32)
Calcium: 8.2 mg/dL — ABNORMAL LOW (ref 8.4–10.5)
Creatinine, Ser: 0.77 mg/dL (ref 0.50–1.10)
GFR calc Af Amer: 89 mL/min — ABNORMAL LOW (ref >90)
GFR calc non Af Amer: 77 mL/min — ABNORMAL LOW (ref >90)
Glucose, Bld: 76 mg/dL (ref 70–99)
Potassium: 4.6 mEq/L (ref 3.7–5.3)
Sodium: 140 mEq/L (ref 137–147)

## 2013-10-01 LAB — URINALYSIS, ROUTINE W REFLEX MICROSCOPIC
Bilirubin Urine: NEGATIVE
Glucose, UA: NEGATIVE mg/dL
HGB URINE DIPSTICK: NEGATIVE
Ketones, ur: NEGATIVE mg/dL
LEUKOCYTES UA: NEGATIVE
Nitrite: NEGATIVE
Protein, ur: NEGATIVE mg/dL
SPECIFIC GRAVITY, URINE: 1.01 (ref 1.005–1.030)
UROBILINOGEN UA: 0.2 mg/dL (ref 0.0–1.0)
pH: 5.5 (ref 5.0–8.0)

## 2013-10-01 MED ORDER — SODIUM CHLORIDE 0.9 % IV SOLN: INTRAVENOUS | Status: DC

## 2013-10-01 NOTE — ED Notes (Signed)
Pt BIB EMS for initial c/o SOB. Pt was alert on EMS arrival with O2 sat of 95%. 100% on 2L O2. Hx of breast ca and dementia. Pt alert and talking on ED arrival.

## 2013-10-01 NOTE — Telephone Encounter (Signed)
Kelli Lopez's daughter-in-law called on behalf of her mother-in-law.  Reports that the patient has been "rambling and confused" since last night, and this is not per her baseline and she is worsening.  With the abrupt and worsening change in status described, I advised that the patient be taken to the emergency room.

## 2013-10-01 NOTE — ED Notes (Signed)
NAD noted prior to d/c home.

## 2013-10-01 NOTE — ED Notes (Signed)
Pt is a/o to person, place, situation and month but not date. Pt states "I don't know why they brought me up here". Denies pain or SOB.

## 2013-10-01 NOTE — ED Provider Notes (Signed)
CSN: [id number]     Arrival date & time 10/01/13  1559 History  This chart was scribed for Richarda Blade, MD by Steva Colder, ED Scribe. The patient was seen in room APA05/APA05 at [facility].      Chief Complaint  Patient presents with  . Shortness of Breath     The history is provided by the patient and a relative. No language interpreter was used.   HPI Comments: Kelli Lopez is a 80 y.o. female with a hx of breast CA who presents to the Emergency Department complaining of SOB. Family states that by looking at the pt it appears that she is breathing hard. Family states that the pt has not urinated since yesterday. Family states that that pt uses a "potty". Family states that the pt is confused. Family states that the confusion began seriously last week. Family states that the pt ambulates with a walker sometimes. Family states that the pt lives with her daughter. Family states that the pt is having associated symptoms of cough, right abdominal pain, and  sneezing. Family states that the pt ate a little bit of food today. Family states that the pt last had a bowel movement 4 days ago. Family denies any other associated symptoms. Family states that the pt is in stage 4 cancer. Family states that the pt is taking a CA pill and has been since March.     Past Medical History  Diagnosis Date  . Renal disorder   . Diabetes mellitus without complication   . Breast cancer   . Hypertension    Past Surgical History  Procedure Laterality Date  . No past surgeries    . Breast biopsy Right 05/10/2013    Procedure: BREAST BIOPSY;  Surgeon: Jamesetta So, MD;  Location: [facility];  Service: General;  Laterality: Right;   Family History  Problem Relation Age of Onset  . Stroke Mother    History  Substance Use Topics  . Smoking status: Never Smoker   . Smokeless tobacco: Never Used  . Alcohol Use: No   OB History   Grav Para Term Preterm Abortions TAB SAB Ect Mult Living                  Review of Systems  HENT: Positive for sneezing.   Respiratory: Positive for cough and shortness of breath.   Gastrointestinal: Positive for abdominal pain (right side).  Genitourinary:       Urinary retention  All other systems reviewed and are negative.     Allergies  Review of patient's allergies indicates no known allergies.  Home Medications   Prior to Admission medications   Medication Sig Start Date End Date Taking? Authorizing Provider  ALPRAZolam (XANAX XR) 0.5 MG 24 hr tablet Take 1 tablet (0.5 mg total) by mouth 2 times daily at 12 noon and 4 pm. 05/11/13  Yes Rosita Fire, MD  amLODipine (NORVASC) 5 MG tablet Take 5 mg by mouth daily. 04/20/13  Yes Historical Provider, MD  HYDROcodone-acetaminophen (NORCO) 7.5-325 MG per tablet Take 1 tablet by mouth every 4 (four) hours as needed for moderate pain. 08/23/13  Yes Farrel Gobble, MD  letrozole [facility]) 2.5 MG tablet Take 1 tablet (2.5 mg total) by mouth daily. 07/29/13  Yes Baird Cancer, PA-C  metFORMIN (GLUCOPHAGE) 500 MG tablet Take 500 mg by mouth daily. Prescribed one tablet twice daily but only takes once daily 05/01/13  Yes Historical Provider, MD  potassium chloride SA (  K-DUR,KLOR-CON) 20 MEQ tablet Take 1 tablet (20 mEq total) by mouth daily. 09/20/13  Yes Darrall Dears, MD  Vitamin D, Ergocalciferol, (DRISDOL) 50000 UNITS CAPS capsule Take 50,000 Units by mouth every 30 (thirty) days.    Yes Historical Provider, MD  camphor-menthol Timoteo Ace) lotion Apply 1 application topically as needed for itching. 08/23/13   Farrel Gobble, MD  predniSONE (DELTASONE) 5 MG tablet Take 1 tablet (5 mg total) by mouth daily with breakfast. 09/20/13   Darrall Dears, MD   BP 130/61  Pulse 74  Temp(Src) 97.7 F (36.5 C) (Oral)  Resp 18  SpO2 92%  Physical Exam  Nursing note and vitals reviewed. Constitutional: She appears well-developed.  Frail and elderly.   HENT:  Head: Normocephalic and atraumatic.  Eyes: Conjunctivae  and EOM are normal. Pupils are equal, round, and reactive to light.  Neck: Normal range of motion and phonation normal. Neck supple.  Cardiovascular: Normal rate, regular rhythm and intact distal pulses.   Pulmonary/Chest: Effort normal. She has no wheezes. She has rales (right lung). She exhibits no tenderness.  Right lung rales, no rhonchi or wheezes. Left lung nl.   Abdominal: Soft. She exhibits no distension. There is no tenderness. There is no guarding.  Musculoskeletal: Normal range of motion.  Neurological: She is alert. No cranial nerve deficit. She exhibits normal muscle tone. Coordination normal.  Skin: Skin is warm and dry.  Psychiatric: She has a normal mood and affect. Her behavior is normal.    ED Course  Procedures (including critical care time) DIAGNOSTIC STUDIES: Oxygen Saturation is 92% on room air, adequate by my interpretation.    COORDINATION OF CARE: 4:23 PM-Discussed treatment plan which includes labs, CXR, In and Out Cath with pt family at bedside and pt family agreed to plan.   Reviewed documentation, from her oncologist. She continues to be treated for breast cancer, with monthly infusions, and daily oral tablet. She has stage IV breast cancer, with local metastases.    Medications  0.9 %  sodium chloride infusion (not administered)    Patient Vitals for the past 24 hrs:  BP Temp Temp src Pulse Resp SpO2  10/01/13 1700 132/59 mmHg - - 75 - 94 %  10/01/13 1659 - 96.8 F (36 C) Rectal - - -  10/01/13 1626 130/61 mmHg 97.7 F (36.5 C) Oral 74 18 92 %  10/01/13 1600 130/61 mmHg - - 76 - 93 %  10/01/13 1558 - - - - - 93 %    6:01 PM Reevaluation with update and discussion. After initial assessment and treatment, an updated evaluation reveals nl VS, no pneumonia, or infection. Niece is concerned about facial expressions and has concern for possible stroke. Family states that the pt was able to eat and drink while here in the ED. Discussed Power of Attorney  with the pt and the family.  Soijett A Blue   6:18 PM- Niece, who is the pt Financial Power of Oneta Rack is present in the room. Discussion on formal Medical Power of Attorney paperwork and the pt preferences. Discussed End of Life care and discussed signing DNR paperwork on Monday while at the pt PCP.    Labs Review Labs Reviewed  CBC WITH DIFFERENTIAL - Abnormal; Notable for the following:    Hemoglobin 10.5 (*)    HCT 33.1 (*)    RDW 17.2 (*)    All other components within normal limits  BASIC METABOLIC PANEL - Abnormal; Notable for the following:  Calcium 8.2 (*)    GFR calc non Af Amer 77 (*)    GFR calc Af Amer 89 (*)    All other components within normal limits  URINE CULTURE  URINALYSIS, ROUTINE W REFLEX MICROSCOPIC    Imaging Review Dg Chest Port 1 View  10/01/2013   CLINICAL DATA:  Shortness of Breath  EXAM: PORTABLE CHEST - 1 VIEW  COMPARISON:  05/06/2013  FINDINGS: Cardiomegaly. Central mild vascular congestion and mild interstitial prominence suspicious for mild interstitial edema. Streaky atelectasis or infiltrate bilateral basilar right greater than left. Atherosclerotic calcifications of thoracic aorta.  IMPRESSION: Central mild vascular congestion and mild interstitial prominence suspicious for mild interstitial edema. Streaky atelectasis or infiltrate bilateral basilar right greater than left.   Electronically Signed   By: Lahoma Crocker M.D.   On: 10/01/2013 16:37     EKG Interpretation None      MDM   Final diagnoses:  Dyspnea  Breast cancer, unspecified laterality     nonspecific, shortness of breath, without clear evidence for pneumonia. Pneumonia is possible. There is no clear evidence for congestive heart failure, urinary tract infection or evidence for metabolic instability, or impending vascular collapse.  Nursing Notes Reviewed/ Care Coordinated Applicable Imaging Reviewed Interpretation of Laboratory Data incorporated into ED treatment  The patient  appears reasonably screened and/or stabilized for discharge and I doubt any other medical condition or other [facility] requiring further screening, evaluation, or treatment in the ED at this time prior to discharge.  Plan: Home Medications- usual; Home Treatments- rest; return here if the recommended treatment, does not improve the symptoms; Recommended follow up- PCP, when necessary    I personally performed the services described in this documentation, which was scribed in my presence. The recorded information has been reviewed and is accurate.    Richarda Blade, MD 10/01/13 2133

## 2013-10-03 LAB — URINE CULTURE
COLONY COUNT: NO GROWTH
Culture: NO GROWTH

## 2013-10-04 ENCOUNTER — Encounter (HOSPITAL_COMMUNITY): Payer: Medicare Other | Attending: Hematology and Oncology

## 2013-10-04 DIAGNOSIS — C50919 Malignant neoplasm of unspecified site of unspecified female breast: Secondary | ICD-10-CM | POA: Insufficient documentation

## 2013-10-04 DIAGNOSIS — R630 Anorexia: Secondary | ICD-10-CM | POA: Insufficient documentation

## 2013-10-04 DIAGNOSIS — E876 Hypokalemia: Secondary | ICD-10-CM

## 2013-10-04 DIAGNOSIS — C7951 Secondary malignant neoplasm of bone: Secondary | ICD-10-CM | POA: Insufficient documentation

## 2013-10-04 DIAGNOSIS — C7952 Secondary malignant neoplasm of bone marrow: Secondary | ICD-10-CM

## 2013-10-04 NOTE — Progress Notes (Signed)
Kelli Lopez presented for BMET only today - labs had been performed x 3 days ago in the ER so labs were not repeated today as scheduled.  Kelli Lopez and her daughter/daughter-in-law were advised to return as scheduled on 10/18/13 for labs, xgeva, and office visit.  Kelli Lopez's last calcium was slightly low, and Dr. Barnet Glasgow advised that she begin taking [place] x 1 tab by mouth twice daily with her Vitamin D - patient and family advised of same and will be rechecked before xgeva administered on 10/18/13.  All verbalized understanding of plan of care.  Lab Results  Component Value Date   CALCIUM 8.2* 10/01/2013

## 2013-10-08 ENCOUNTER — Telehealth (HOSPITAL_COMMUNITY): Payer: Self-pay | Admitting: *Deleted

## 2013-10-09 ENCOUNTER — Encounter (HOSPITAL_COMMUNITY): Payer: Self-pay | Admitting: Emergency Medicine

## 2013-10-09 ENCOUNTER — Emergency Department (HOSPITAL_COMMUNITY): Payer: Medicare Other

## 2013-10-09 ENCOUNTER — Emergency Department (HOSPITAL_COMMUNITY)
Admission: EM | Admit: 2013-10-09 | Discharge: 2013-10-09 | Disposition: A | Discharge status: Home / Self Care | Payer: Medicare Other | Attending: Emergency Medicine | Admitting: Emergency Medicine

## 2013-10-09 DIAGNOSIS — R5383 Other fatigue: Secondary | ICD-10-CM

## 2013-10-09 DIAGNOSIS — Z79899 Other long term (current) drug therapy: Secondary | ICD-10-CM | POA: Diagnosis not present

## 2013-10-09 DIAGNOSIS — R109 Unspecified abdominal pain: Secondary | ICD-10-CM | POA: Insufficient documentation

## 2013-10-09 DIAGNOSIS — E119 Type 2 diabetes mellitus without complications: Secondary | ICD-10-CM | POA: Diagnosis not present

## 2013-10-09 DIAGNOSIS — I10 Essential (primary) hypertension: Secondary | ICD-10-CM | POA: Diagnosis not present

## 2013-10-09 DIAGNOSIS — C50911 Malignant neoplasm of unspecified site of right female breast: Secondary | ICD-10-CM

## 2013-10-09 DIAGNOSIS — R443 Hallucinations, unspecified: Secondary | ICD-10-CM | POA: Insufficient documentation

## 2013-10-09 DIAGNOSIS — C50919 Malignant neoplasm of unspecified site of unspecified female breast: Secondary | ICD-10-CM | POA: Diagnosis not present

## 2013-10-09 DIAGNOSIS — R5381 Other malaise: Secondary | ICD-10-CM | POA: Diagnosis present

## 2013-10-09 DIAGNOSIS — Z87448 Personal history of other diseases of urinary system: Secondary | ICD-10-CM | POA: Insufficient documentation

## 2013-10-09 LAB — COMPREHENSIVE METABOLIC PANEL
ALBUMIN: 2.6 g/dL — AB (ref 3.5–5.2)
ALT: 8 U/L (ref 0–35)
ANION GAP: 13 (ref 5–15)
AST: 26 U/L (ref 0–37)
Alkaline Phosphatase: 88 U/L (ref 39–117)
BUN: 11 mg/dL (ref 6–23)
CALCIUM: 8.7 mg/dL (ref 8.4–10.5)
CO2: 21 mEq/L (ref 19–32)
Chloride: 101 mEq/L (ref 96–112)
Creatinine, Ser: 0.9 mg/dL (ref 0.50–1.10)
GFR calc Af Amer: 68 mL/min — ABNORMAL LOW (ref >90)
GFR calc non Af Amer: 58 mL/min — ABNORMAL LOW (ref >90)
GLUCOSE: 75 mg/dL (ref 70–99)
Potassium: 4.4 mEq/L (ref 3.7–5.3)
Sodium: 135 mEq/L — ABNORMAL LOW (ref 137–147)
TOTAL PROTEIN: 7.5 g/dL (ref 6.0–8.3)
Total Bilirubin: 0.3 mg/dL (ref 0.3–1.2)

## 2013-10-09 LAB — CBC WITH DIFFERENTIAL/PLATELET
BASOS PCT: 1 % (ref 0–1)
Basophils Absolute: 0 10*3/uL (ref 0.0–0.1)
EOS ABS: 0.1 10*3/uL (ref 0.0–0.7)
EOS PCT: 3 % (ref 0–5)
HEMATOCRIT: 31 % — AB (ref 36.0–46.0)
HEMOGLOBIN: 10.1 g/dL — AB (ref 12.0–15.0)
Lymphocytes Relative: 33 % (ref 12–46)
Lymphs Abs: 1.4 10*3/uL (ref 0.7–4.0)
MCH: 26.9 pg (ref 26.0–34.0)
MCHC: 32.6 g/dL (ref 30.0–36.0)
MCV: 82.7 fL (ref 78.0–100.0)
MONO ABS: 0.6 10*3/uL (ref 0.1–1.0)
MONOS PCT: 15 % — AB (ref 3–12)
Neutro Abs: 2.1 10*3/uL (ref 1.7–7.7)
Neutrophils Relative %: 48 % (ref 43–77)
Platelets: 201 10*3/uL (ref 150–400)
RBC: 3.75 MIL/uL — ABNORMAL LOW (ref 3.87–5.11)
RDW: 17 % — ABNORMAL HIGH (ref 11.5–15.5)
WBC: 4.3 10*3/uL (ref 4.0–10.5)

## 2013-10-09 LAB — URINALYSIS, ROUTINE W REFLEX MICROSCOPIC
Bilirubin Urine: NEGATIVE
Glucose, UA: NEGATIVE mg/dL
Hgb urine dipstick: NEGATIVE
Ketones, ur: NEGATIVE mg/dL
LEUKOCYTES UA: NEGATIVE
Nitrite: NEGATIVE
Protein, ur: NEGATIVE mg/dL
Specific Gravity, Urine: <1.005 — ABNORMAL LOW (ref 1.005–1.030)
UROBILINOGEN UA: 0.2 mg/dL (ref 0.0–1.0)
pH: 5.5 (ref 5.0–8.0)

## 2013-10-09 LAB — PROTIME-INR
INR: 1.21 (ref 0.00–1.49)
PROTHROMBIN TIME: 15.3 s — AB (ref 11.6–15.2)

## 2013-10-09 LAB — AMMONIA: AMMONIA: 23 umol/L (ref 11–60)

## 2013-10-09 MED ORDER — SODIUM CHLORIDE 0.9 % IV SOLN
1000.0000 mL | INTRAVENOUS | Status: DC
  Administered 2013-10-09: 1000 mL via INTRAVENOUS

## 2013-10-09 NOTE — ED Notes (Signed)
Pt has hx of breast CA, no treatments, pt becoming more confused and lethargic, pt does not have DNR paperwork, cancerous area noted to right breast

## 2013-10-09 NOTE — ED Notes (Signed)
Lab called about needing lab draw for pt. They are aware.

## 2013-10-09 NOTE — ED Notes (Signed)
Dr. At bedside to discuss options.

## 2013-10-09 NOTE — ED Provider Notes (Signed)
CSN: [id number]     Arrival date & time 10/09/13  1623 History  This chart was scribed for Dorie Rank, MD by Jeanell Sparrow, ED Scribe. This patient was seen in room [facility]03/[facility]03 and the patient's care was started at 4:30 PM.     Chief Complaint  Patient presents with  . Weakness   The history is provided by a relative. No language interpreter was used.   HPI Comments: Kelli Lopez is a 80 y.o. female who presents to the Emergency Department complaining of constant moderate worsening generalized weakness that started about a week ago. Relative states that pt has stage IV breast cancer. She reports that pt has been getting weak and has not been able to use the bathroom by herself. She states that pt has also been having intermittent episodes of confusion, hallucinations, and unresponsiveness. She also reports that the pt has not been able to ambulate this week. She states that pt complained of abdominal pain earlier this week. She reports pt is currently not receiving any treatment or hospice care for her breast cancer. She denies any nausea, emesis, and diarrhea in pt.  Family has been in contact with her oncologist.  She is not currently on hospice but they are interested.  Pt has not been interested in getting any treatments for the cancer. Past Medical History  Diagnosis Date  . Renal disorder   . Diabetes mellitus without complication   . Breast cancer   . Hypertension    Past Surgical History  Procedure Laterality Date  . No past surgeries    . Breast biopsy Right 05/10/2013    Procedure: BREAST BIOPSY;  Surgeon: Jamesetta So, MD;  Location: [facility];  Service: General;  Laterality: Right;   Family History  Problem Relation Age of Onset  . Stroke Mother    History  Substance Use Topics  . Smoking status: Never Smoker   . Smokeless tobacco: Never Used  . Alcohol Use: No   OB History   Grav Para Term Preterm Abortions TAB SAB Ect Mult Living                 Review of Systems   Constitutional: Positive for activity change. Negative for fever.  Gastrointestinal: Positive for abdominal pain. Negative for nausea, vomiting and diarrhea.  Neurological: Positive for weakness.  Psychiatric/Behavioral: Positive for hallucinations.  All other systems reviewed and are negative.   Allergies  Review of patient's allergies indicates no known allergies.  Home Medications   Prior to Admission medications   Medication Sig Start Date End Date Taking? Authorizing Provider  ALPRAZolam (XANAX XR) 0.5 MG 24 hr tablet Take 1 tablet (0.5 mg total) by mouth 2 times daily at 12 noon and 4 pm. 05/11/13   Rosita Fire, MD  amLODipine (NORVASC) 5 MG tablet Take 5 mg by mouth daily. 04/20/13   Historical Provider, MD  camphor-menthol Timoteo Ace) lotion Apply 1 application topically as needed for itching. 08/23/13   Farrel Gobble, MD  HYDROcodone-acetaminophen ([place]) 7.5-325 MG per tablet Take 1 tablet by mouth every 4 (four) hours as needed for moderate pain. 08/23/13   Farrel Gobble, MD  letrozole [facility]) 2.5 MG tablet Take 1 tablet (2.5 mg total) by mouth daily. 07/29/13   Baird Cancer, PA-C  losartan-hydrochlorothiazide (HYZAAR) 50-12.5 MG per tablet Take 1 tablet by mouth daily. 07/30/13   Historical Provider, MD  metFORMIN (GLUCOPHAGE) 500 MG tablet Take 500 mg by mouth daily. Prescribed one tablet twice daily but  only takes once daily 05/01/13   Historical Provider, MD  omeprazole (PRILOSEC) 20 MG capsule Take 20 mg by mouth daily. 09/03/13   Historical Provider, MD  potassium chloride SA (K-DUR,KLOR-CON) 20 MEQ tablet Take 1 tablet (20 mEq total) by mouth daily. 09/20/13   Darrall Dears, MD  predniSONE (DELTASONE) 5 MG tablet Take 1 tablet (5 mg total) by mouth daily with breakfast. 09/20/13   Darrall Dears, MD  Vitamin D, Ergocalciferol, (DRISDOL) 50000 UNITS CAPS capsule Take 50,000 Units by mouth every 30 (thirty) days.     Historical Provider, MD   BP 103/60  Pulse 76   Temp(Src) 97.8 F (36.6 C) (Oral)  SpO2 100% Physical Exam  Nursing note and vitals reviewed. Constitutional: No distress.  Frail, elderly   HENT:  Head: Normocephalic and atraumatic.  Right Ear: External ear normal.  Left Ear: External ear normal.  Mouth/Throat: No oropharyngeal exudate.  MM dry   Eyes: Conjunctivae are normal. Right eye exhibits no discharge. Left eye exhibits no discharge. No scleral icterus.  Neck: Neck supple. No tracheal deviation present.  Cardiovascular: Normal rate, regular rhythm and intact distal pulses.   Pulmonary/Chest: Effort normal and breath sounds normal. No stridor. No respiratory distress. She has no wheezes. She has no rales. Right breast exhibits mass and skin change. Right breast exhibits no tenderness. Breasts are asymmetrical.  Large ulcerative firm mass right breast  Abdominal: Soft. Bowel sounds are normal. She exhibits no distension. There is no tenderness. There is no rebound and no guarding.  Musculoskeletal: She exhibits no edema and no tenderness.  Neurological: She is alert. She displays atrophy. She displays no tremor. No cranial nerve deficit (no facial droop, extraocular movements intact, no slurred speech) or sensory deficit. She displays no seizure activity. GCS eye subscore is 4. GCS verbal subscore is 4. GCS motor subscore is 6.  General weakness  Skin: Skin is warm and dry. No rash noted. She is not diaphoretic.  Psychiatric: She has a normal mood and affect.    ED Course  Procedures (including critical care time) DIAGNOSTIC STUDIES: Oxygen Saturation is 100% on RA, normal by my interpretation.    COORDINATION OF CARE: 4:34 PM- Pt advised of plan for treatment which includes medication, radiology, and labs and pt agrees.  Labs Review Labs Reviewed  CBC WITH DIFFERENTIAL - Abnormal; Notable for the following:    RBC 3.75 (*)    Hemoglobin 10.1 (*)    HCT 31.0 (*)    RDW 17.0 (*)    Monocytes Relative 15 (*)    All  other components within normal limits  COMPREHENSIVE METABOLIC PANEL - Abnormal; Notable for the following:    Sodium 135 (*)    Albumin 2.6 (*)    GFR calc non Af Amer 58 (*)    GFR calc Af Amer 68 (*)    All other components within normal limits  PROTIME-INR - Abnormal; Notable for the following:    Prothrombin Time 15.3 (*)    All other components within normal limits  URINALYSIS, ROUTINE W REFLEX MICROSCOPIC - Abnormal; Notable for the following:    Specific Gravity, Urine <1.005 (*)    All other components within normal limits  AMMONIA    Imaging Review Dg Chest 1 View  10/09/2013   CLINICAL DATA:  Weakness and shortness of breath.  EXAM: CHEST - 1 VIEW  COMPARISON:  Single view of the chest 10/01/2013.  FINDINGS: Airspace disease in the right mid and lower  lung zones persists without marked change. Small bilateral pleural effusions, greater on the right, have increased. No pneumothorax identified. Heart size is normal.  IMPRESSION: No marked change in extensive airspace disease in the right chest.  Some increase in small bilateral pleural effusions.   Electronically Signed   By: Inge Rise M.D.   On: 10/09/2013 17:57   Ct Head Wo Contrast  10/09/2013   CLINICAL DATA:  Weakness.  EXAM: CT HEAD WITHOUT CONTRAST  TECHNIQUE: Contiguous axial images were obtained from the base of the skull through the vertex without intravenous contrast.  COMPARISON:  Head CT scan 05/06/2013.  FINDINGS: Cortical atrophy and chronic microvascular ischemic change are again seen. There is no evidence of acute intracranial abnormality including infarct, hemorrhage, mass lesion, mass effect, midline shift or abnormal extra-axial fluid collection. No hydrocephalus or pneumocephalus. The calvarium is intact. Carotid atherosclerosis is noted.  IMPRESSION: No acute finding. Atrophy and chronic microvascular ischemic change.   Electronically Signed   By: Inge Rise M.D.   On: 10/09/2013 17:58     EKG  Interpretation   Date/Time:  Saturday October 09 2013 16:46:34 EDT Ventricular Rate:  72 PR Interval:  158 QRS Duration: 84 QT Interval:  401 QTC Calculation: 439 R Axis:   146 Text Interpretation:  Right and left arm electrode reversal,  interpretation assumes no reversal Sinus or ectopic atrial rhythm Right  axis deviation Probable anteroseptal infarct, old nonspecific t wave  changes since prior ecg Confirmed by Enas Winchel  MD-J, Darrow Barreiro ([id number]) on 10/09/2013  5:02:04 PM      MDM   Final diagnoses:  Breast CA, right   1842  Pt is more alert and talkative.  Speaking to her family. Discussed the findings with the patient and her daughter who is her caregiver. Patient does not appear to have any sign of acute infection. There's no signs of any metastases to the brain on her head CT. Her renal function is unremarkable. Her anemia is at her baseline. Her weakness and somnolence may be multifactorial but I suspect it could be related to her breast cancer itself as well as side effects from some of her medications such as hydrocodone.  Family is comfortable taking the patient home. There is no acute indication to have her admitted at this time. Patient does have severe breast cancer and she has opted for nor specific treatment at this time.   I personally performed the services described in this documentation, which was scribed in my presence.  The recorded information has been reviewed and is accurate.   Dorie Rank, MD 10/09/13 [id number]

## 2013-10-09 NOTE — ED Notes (Addendum)
Pt a&o x4, now answering questions, cooperative during in and out cath

## 2013-10-09 NOTE — ED Notes (Addendum)
Pt. With breast cancer, not taking treatments for breast cancer, pt. Presents with weakness, more lethargic than usual, not eating per family. CBG 100 in route

## 2013-10-12 ENCOUNTER — Ambulatory Visit (HOSPITAL_COMMUNITY): Payer: Medicare Other | Admitting: Oncology

## 2013-10-12 ENCOUNTER — Ambulatory Visit (HOSPITAL_COMMUNITY): Payer: Medicare Other

## 2013-10-13 ENCOUNTER — Encounter (HOSPITAL_BASED_OUTPATIENT_CLINIC_OR_DEPARTMENT_OTHER): Payer: Medicare Other | Admitting: Oncology

## 2013-10-13 ENCOUNTER — Ambulatory Visit (HOSPITAL_COMMUNITY): Payer: Medicare Other

## 2013-10-13 ENCOUNTER — Encounter (HOSPITAL_COMMUNITY): Payer: Self-pay | Admitting: Oncology

## 2013-10-13 VITALS — BP 104/70 | HR 90 | Temp 98.7°F | Resp 20

## 2013-10-13 DIAGNOSIS — R5381 Other malaise: Secondary | ICD-10-CM

## 2013-10-13 DIAGNOSIS — C50919 Malignant neoplasm of unspecified site of unspecified female breast: Secondary | ICD-10-CM

## 2013-10-13 DIAGNOSIS — R5383 Other fatigue: Secondary | ICD-10-CM

## 2013-10-13 DIAGNOSIS — C50911 Malignant neoplasm of unspecified site of right female breast: Secondary | ICD-10-CM

## 2013-10-13 DIAGNOSIS — C7951 Secondary malignant neoplasm of bone: Secondary | ICD-10-CM

## 2013-10-13 DIAGNOSIS — R4182 Altered mental status, unspecified: Secondary | ICD-10-CM

## 2013-10-13 DIAGNOSIS — C7952 Secondary malignant neoplasm of bone marrow: Secondary | ICD-10-CM

## 2013-10-13 DIAGNOSIS — R627 Adult failure to thrive: Secondary | ICD-10-CM

## 2013-10-13 NOTE — Patient Instructions (Addendum)
..  [facility] Discharge Instructions  RECOMMENDATIONS MADE BY THE CONSULTANT AND ANY TEST RESULTS WILL BE SENT TO YOUR REFERRING PHYSICIAN.  EXAM FINDINGS BY THE PHYSICIAN TODAY AND SIGNS OR SYMPTOMS TO REPORT TO CLINIC OR PRIMARY PHYSICIAN: Exam and findings as discussed by T.Kefalas PA.  We will send information to the Hospice nurses, they will call to come out and see you and will help [place] be able to take care of you at home. They will help you with controlling your pain. You can continue femara and quit when you run out or you may quit now. It is entirely up to you INSTRUCTIONS/FOLLOW-UP: As needed.  Thank you for choosing [facility] to provide your oncology and hematology care.  To afford each patient quality time with our providers, please arrive at least 15 minutes before your scheduled appointment time.  With your help, our goal is to use those 15 minutes to complete the necessary work-up to ensure our physicians have the information they need to help with your evaluation and healthcare recommendations.    Effective January 1st, 2014, we ask that you re-schedule your appointment with our physicians should you arrive 10 or more minutes late for your appointment.  We strive to give you quality time with our providers, and arriving late affects you and other patients whose appointments are after yours.    Again, thank you for choosing [facility].  Our hope is that these requests will decrease the amount of time that you wait before being seen by our physicians.       _____________________________________________________________  Should you have questions after your visit to [facility], please contact our office at [phone] between the hours of 8:30 a.m. and 4:30 p.m.  Voicemails left after 4:30 p.m. will not be returned until the following business day.  For prescription refill requests, have your pharmacy contact our office  with your prescription refill request.    _______________________________________________________________  We hope that we have given you very good care.  You may receive a patient satisfaction survey in the mail, please complete it and return it as soon as possible.  We value your feedback!  _______________________________________________________________  Have you asked about our STAR program?  STAR stands for Survivorship Training and Rehabilitation, and this is a nationally recognized cancer care program that focuses on survivorship and rehabilitation.  Cancer and cancer treatments may cause problems, such as, pain, making you feel tired and keeping you from doing the things that you need or want to do. Cancer rehabilitation can help. Our goal is to reduce these troubling effects and help you have the best quality of life possible.  You may receive a survey from a nurse that asks questions about your current state of health.  Based on the survey results, all eligible patients will be referred to the [facility] program for an evaluation so we can better serve you!  A frequently asked questions sheet is available upon request.

## 2013-10-13 NOTE — Progress Notes (Signed)
Mckay is seen as a work-in today.  She was recently seen in the ED with weakness and change in mental status.  Her condition improved in the ED and she was discharged home.  Family has called and requested a sooner appointment.  She was scheduled to see me on 8/18, but family rescheduled to today.  Prior to her appointment time, around 11:30 AM, family called reporting that over the past few days to week, Jennelle has been declining to the point where she cannot take care of herself and is unable to drink for herself.  I encouraged her to report to the ED or come to her office visit appointment with me today.  They were able to make it to the office visit.  The family is concerned about Danyiel's decline.  She is an ECOG of 4 today.  She reports that she is only taking 2 pain medications per day on average.  If that is true, then she is not taking much pain medication (not enough to make her ECOG status change of this magnitude).    We broached the topic of Hospice.  The family is agreeable.  They had concerns about stopping the patient's AI therapy which will likely be required when enrolled with Hospice.  I am afraid that some of the patient's pain, based upon the description, is AI-induced.  She is welcome to compete the RX she has at home if desired.  After a lengthy Hospice discussion, the family and patient were agreeable to this referral.  The only point of conversation was about stopping therapy for metastatic breast cancer.  Given the fact that the patient is declining rapidly, she is hospice appropriate and AI therapy is likely failing.  She wishes to remain at home and the patient has a great family support system.    Given her Stage IV metastatic breast cancer to bone, with significant ECOG decline, Hospice is appropriate.  Referral is made.  Patient and family support plan of action.  Patient and plan discussed with Dr. Farrel Gobble and he is in agreement with the aforementioned.    KEFALAS,THOMAS 10/13/2013

## 2013-10-15 ENCOUNTER — Other Ambulatory Visit (HOSPITAL_COMMUNITY): Payer: Self-pay | Admitting: Oncology

## 2013-10-15 DIAGNOSIS — C50911 Malignant neoplasm of unspecified site of right female breast: Secondary | ICD-10-CM

## 2013-10-15 MED ORDER — LORAZEPAM 2 MG/ML PO CONC: 1.0000 mg | ORAL | Status: AC | PRN

## 2013-10-15 MED ORDER — MORPHINE SULFATE (CONCENTRATE) 20 MG/ML PO SOLN: 10.0000 mg | ORAL | Status: AC | PRN

## 2013-10-18 ENCOUNTER — Ambulatory Visit (HOSPITAL_COMMUNITY): Payer: Medicare Other

## 2013-10-19 NOTE — Progress Notes (Signed)
This encounter was created in error - please disregard.

## 2013-10-22 ENCOUNTER — Other Ambulatory Visit (HOSPITAL_COMMUNITY): Payer: Self-pay | Admitting: Oncology

## 2013-10-22 DIAGNOSIS — C50911 Malignant neoplasm of unspecified site of right female breast: Secondary | ICD-10-CM

## 2013-10-22 MED ORDER — HYDROCODONE-ACETAMINOPHEN 7.5-325 MG PO TABS: 1.0000 | ORAL_TABLET | ORAL | Status: AC | PRN

## 2013-11-25 DEATH — deceased

## 2016-05-29 IMAGING — CT CT HEAD W/O CM
1 series · 16 of 30 positions shown, 20 images · non-contrast
Comparison: Head CT scan 05/06/2013.

CLINICAL DATA: Weakness.

EXAM:
CT HEAD WITHOUT CONTRAST
TECHNIQUE: Contiguous axial images were obtained from the base of the skull
through the vertex without intravenous contrast.

[Series 2: headtrauma 4.8 h37s · axial · 0.44mm/px · z∈[+107,+235]mm · 16 of 30 slices shown, 20 images]
[im 2/30  brain]
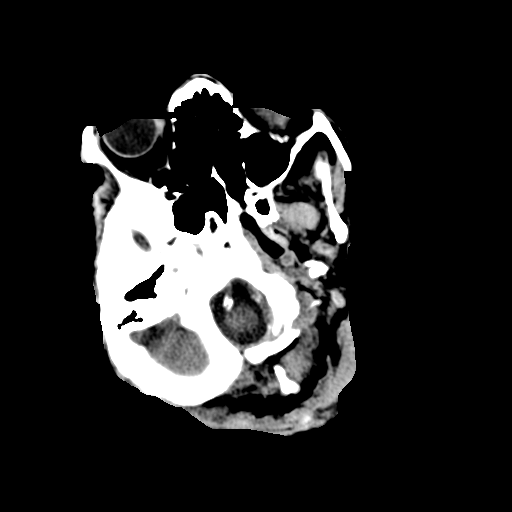
[im 2/30  bone]
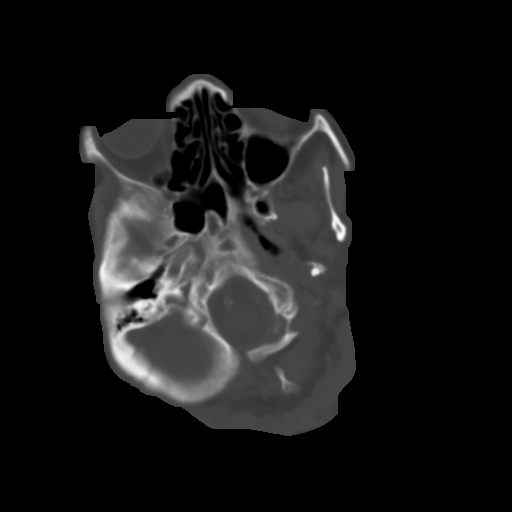
[im 4/30  brain]
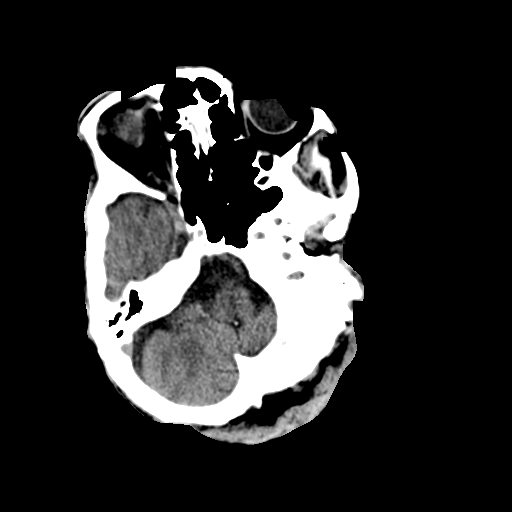
[im 6/30  brain]
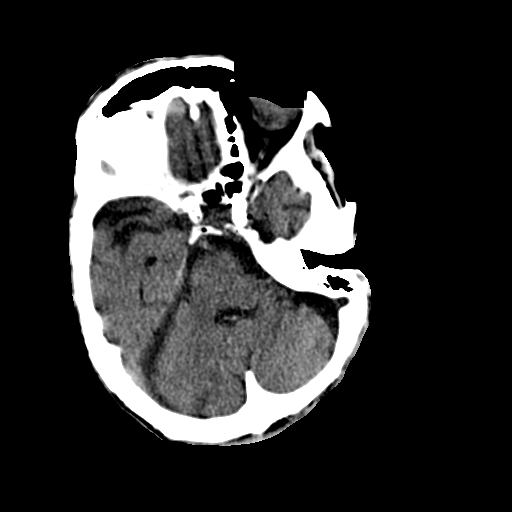
[im 8/30  brain]
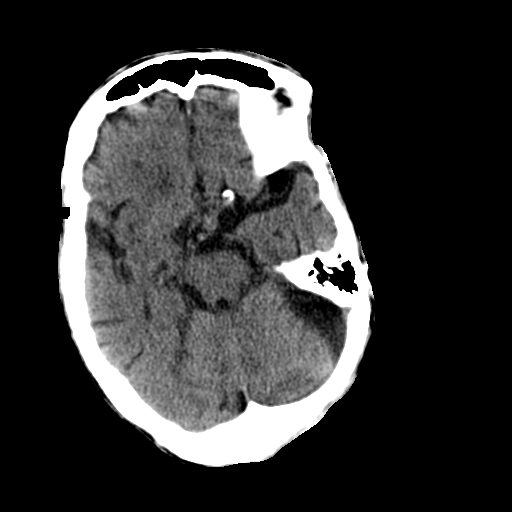
[im 9/30  brain]
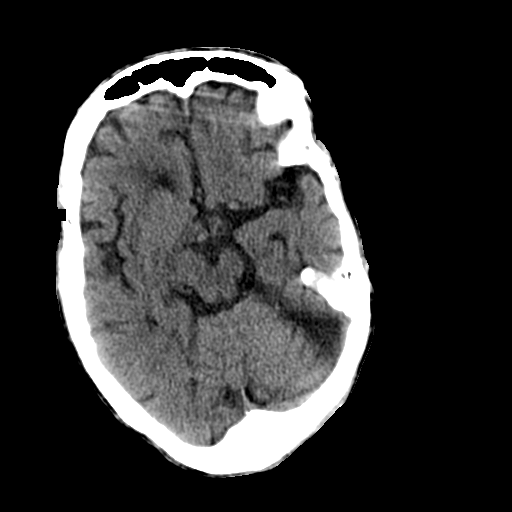
[im 9/30  bone]
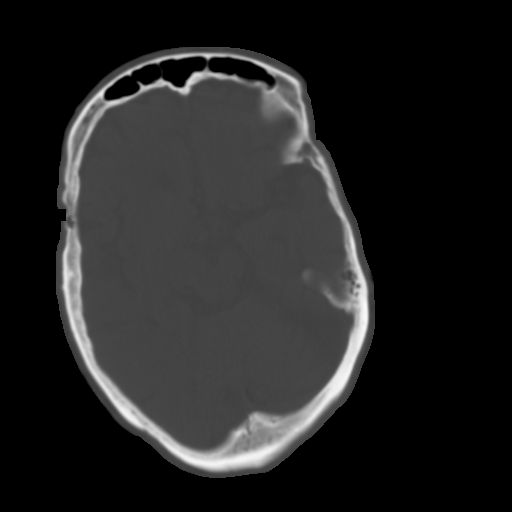
[im 11/30  brain]
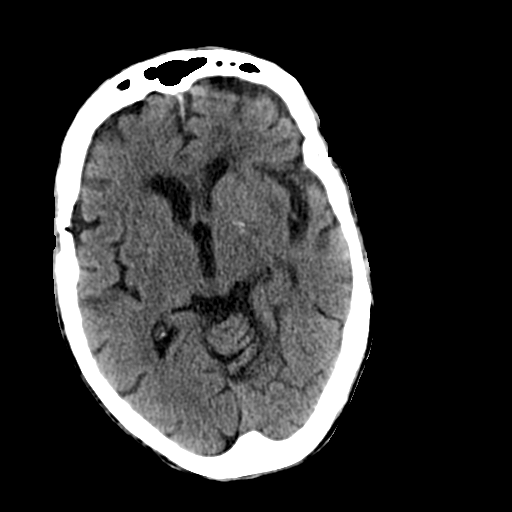
[im 13/30  brain]
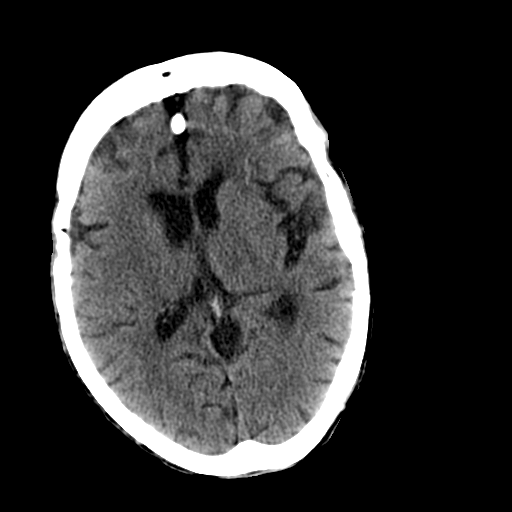
[im 15/30  brain]
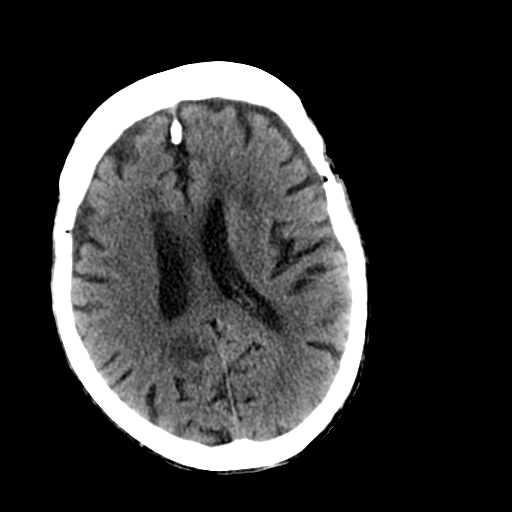
[im 16/30  brain]
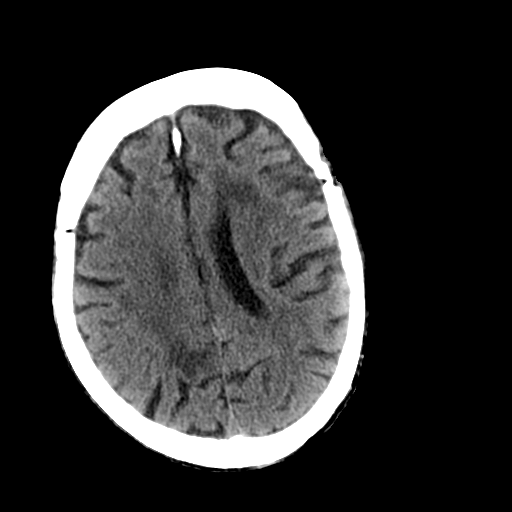
[im 16/30  bone]
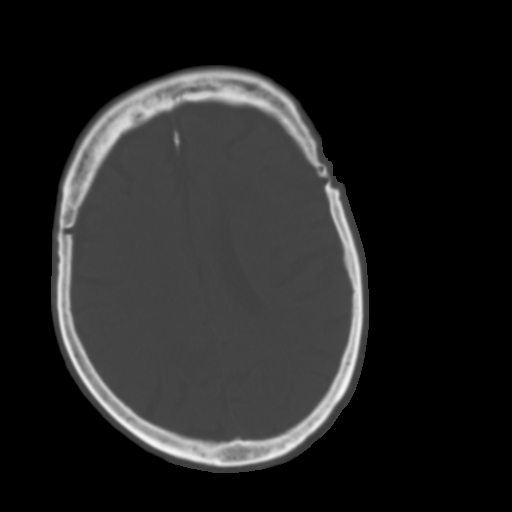
[im 18/30  brain]
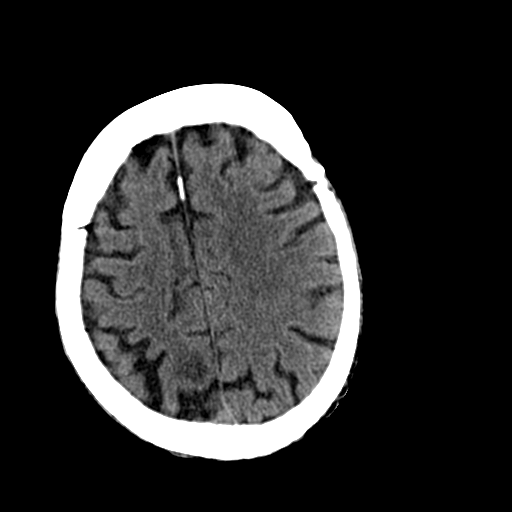
[im 20/30  brain]
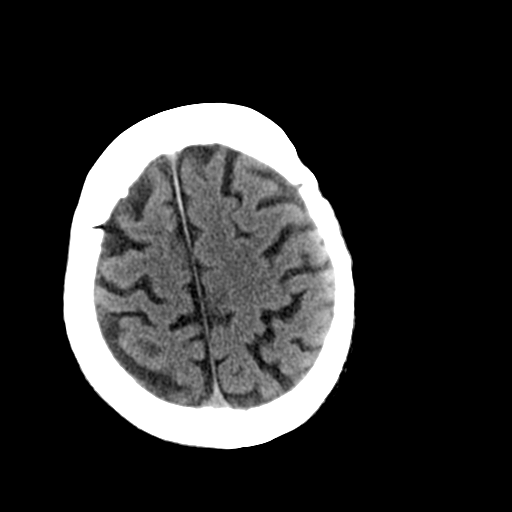
[im 22/30  brain]
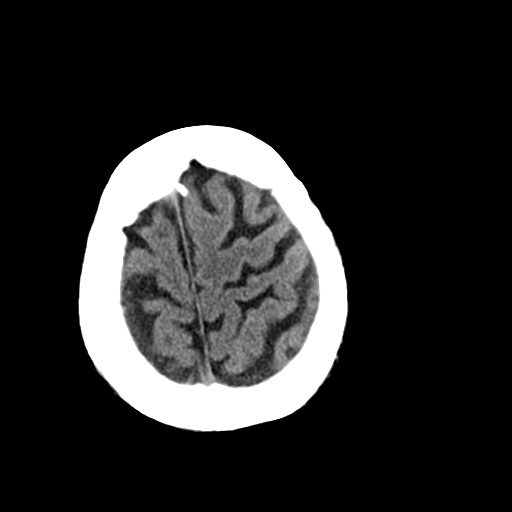
[im 23/30  brain]
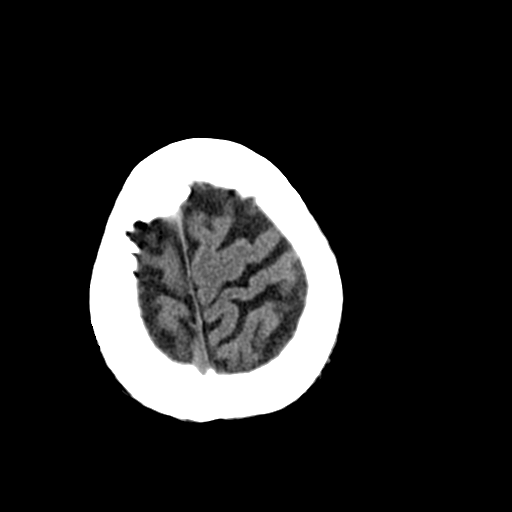
[im 23/30  bone]
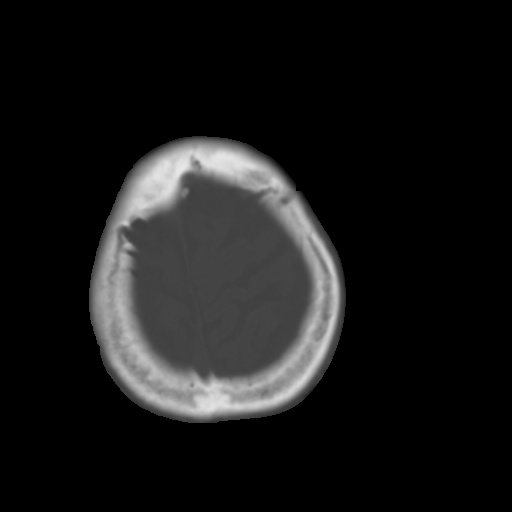
[im 25/30  brain]
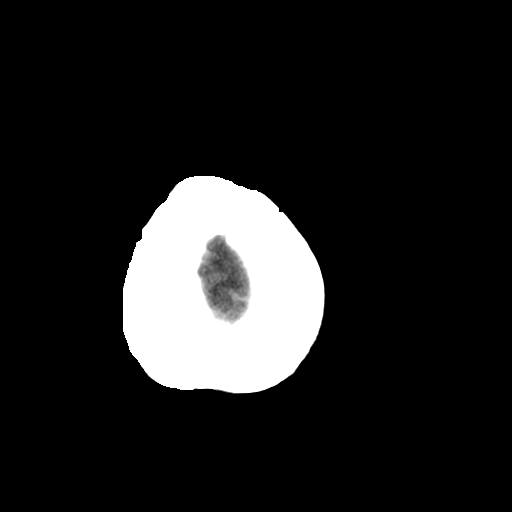
[im 27/30  brain]
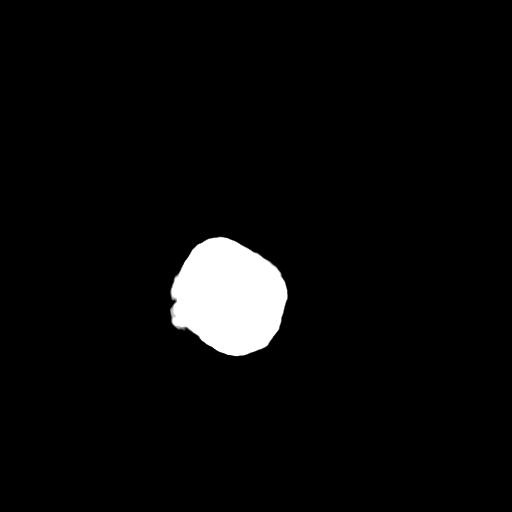
[im 29/30  brain]
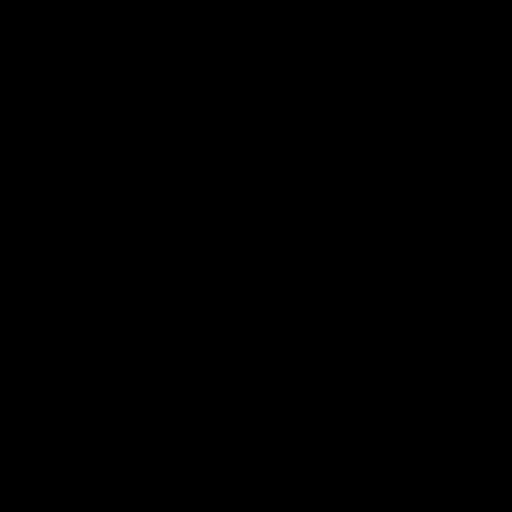

[16 of 30 positions shown; findings below may reference images not displayed]

FINDINGS: Cortical atrophy and chronic microvascular ischemic change are again
seen. There is no evidence of acute intracranial abnormality
including infarct, hemorrhage, mass lesion, mass effect, midline
shift or abnormal extra-axial fluid collection. No hydrocephalus or
pneumocephalus. The calvarium is intact. Carotid atherosclerosis is
noted.
IMPRESSION: No acute finding. Atrophy and chronic microvascular ischemic change.
